# Patient Record
Sex: Male | Born: 1950 | Race: White | Hispanic: No | Marital: Married | Smoking: Never smoker
Health system: Southern US, Community
[De-identification: ages and names within clinical notes are randomized; demographics above are authoritative.]

## PROBLEM LIST (undated history)

## (undated) DIAGNOSIS — E785 Hyperlipidemia, unspecified: Secondary | ICD-10-CM

## (undated) DIAGNOSIS — I2510 Atherosclerotic heart disease of native coronary artery without angina pectoris: Secondary | ICD-10-CM

## (undated) DIAGNOSIS — I219 Acute myocardial infarction, unspecified: Secondary | ICD-10-CM

## (undated) DIAGNOSIS — E119 Type 2 diabetes mellitus without complications: Secondary | ICD-10-CM

## (undated) DIAGNOSIS — I10 Essential (primary) hypertension: Secondary | ICD-10-CM

## (undated) HISTORY — PX: BACK SURGERY: SHX140

## (undated) HISTORY — PX: APPENDECTOMY: SHX54

## (undated) HISTORY — PX: CHOLECYSTECTOMY: SHX55

---

## 2004-07-19 ENCOUNTER — Emergency Department: Payer: Self-pay | Admitting: Emergency Medicine

## 2004-08-02 ENCOUNTER — Ambulatory Visit: Payer: Self-pay | Admitting: Family Medicine

## 2005-11-15 ENCOUNTER — Ambulatory Visit: Payer: Self-pay | Admitting: Family Medicine

## 2015-04-04 ENCOUNTER — Observation Stay
Admission: EM | Admit: 2015-04-04 | Discharge: 2015-04-05 | Disposition: A | Discharge status: Home / Self Care | Payer: Medicare HMO | Attending: Specialist | Admitting: Specialist

## 2015-04-04 ENCOUNTER — Emergency Department: Payer: Medicare HMO

## 2015-04-04 ENCOUNTER — Encounter: Payer: Self-pay | Admitting: Emergency Medicine

## 2015-04-04 DIAGNOSIS — I10 Essential (primary) hypertension: Secondary | ICD-10-CM | POA: Diagnosis present

## 2015-04-04 DIAGNOSIS — I209 Angina pectoris, unspecified: Secondary | ICD-10-CM | POA: Diagnosis present

## 2015-04-04 DIAGNOSIS — I517 Cardiomegaly: Secondary | ICD-10-CM | POA: Insufficient documentation

## 2015-04-04 DIAGNOSIS — E119 Type 2 diabetes mellitus without complications: Secondary | ICD-10-CM | POA: Insufficient documentation

## 2015-04-04 DIAGNOSIS — R9431 Abnormal electrocardiogram [ECG] [EKG]: Secondary | ICD-10-CM | POA: Insufficient documentation

## 2015-04-04 DIAGNOSIS — I25110 Atherosclerotic heart disease of native coronary artery with unstable angina pectoris: Principal | ICD-10-CM | POA: Insufficient documentation

## 2015-04-04 DIAGNOSIS — Z794 Long term (current) use of insulin: Secondary | ICD-10-CM | POA: Diagnosis not present

## 2015-04-04 DIAGNOSIS — E876 Hypokalemia: Secondary | ICD-10-CM | POA: Insufficient documentation

## 2015-04-04 DIAGNOSIS — Z833 Family history of diabetes mellitus: Secondary | ICD-10-CM | POA: Insufficient documentation

## 2015-04-04 DIAGNOSIS — E785 Hyperlipidemia, unspecified: Secondary | ICD-10-CM | POA: Insufficient documentation

## 2015-04-04 DIAGNOSIS — I252 Old myocardial infarction: Secondary | ICD-10-CM | POA: Insufficient documentation

## 2015-04-04 DIAGNOSIS — Z8249 Family history of ischemic heart disease and other diseases of the circulatory system: Secondary | ICD-10-CM | POA: Diagnosis not present

## 2015-04-04 DIAGNOSIS — Z7982 Long term (current) use of aspirin: Secondary | ICD-10-CM | POA: Insufficient documentation

## 2015-04-04 DIAGNOSIS — I2510 Atherosclerotic heart disease of native coronary artery without angina pectoris: Secondary | ICD-10-CM | POA: Diagnosis present

## 2015-04-04 DIAGNOSIS — R079 Chest pain, unspecified: Secondary | ICD-10-CM

## 2015-04-04 DIAGNOSIS — Z9049 Acquired absence of other specified parts of digestive tract: Secondary | ICD-10-CM | POA: Insufficient documentation

## 2015-04-04 DIAGNOSIS — Z79899 Other long term (current) drug therapy: Secondary | ICD-10-CM | POA: Insufficient documentation

## 2015-04-04 HISTORY — DX: Atherosclerotic heart disease of native coronary artery without angina pectoris: I25.10

## 2015-04-04 HISTORY — DX: Hyperlipidemia, unspecified: E78.5

## 2015-04-04 HISTORY — DX: Type 2 diabetes mellitus without complications: E11.9

## 2015-04-04 HISTORY — DX: Essential (primary) hypertension: I10

## 2015-04-04 HISTORY — DX: Acute myocardial infarction, unspecified: I21.9

## 2015-04-04 LAB — CBC
HEMATOCRIT: 39.9 % — AB (ref 40.0–52.0)
Hemoglobin: 13.8 g/dL (ref 13.0–18.0)
MCH: 30.4 pg (ref 26.0–34.0)
MCHC: 34.7 g/dL (ref 32.0–36.0)
MCV: 87.6 fL (ref 80.0–100.0)
Platelets: 150 10*3/uL (ref 150–440)
RBC: 4.55 MIL/uL (ref 4.40–5.90)
RDW: 13.9 % (ref 11.5–14.5)
WBC: 7.6 10*3/uL (ref 3.8–10.6)

## 2015-04-04 MED ORDER — ASPIRIN 81 MG PO CHEW: 324.0000 mg | CHEWABLE_TABLET | Freq: Once | ORAL | Status: DC

## 2015-04-04 NOTE — ED Notes (Addendum)
Pt presents to ED via [facility] from home c/o left sided chest pain beginning at 9 pm tonight. Pt reports took TUMS with "some relief." Pt has hx of HTN and MI. Pt states had similar indigestion feeling with my previous MI. EMS gave patient 2 Nitro sprays with relief and 324 mg Aspirin. Pt denies shortness of breath, dizziness, or N/V/D. Skin warm and dry, respirations even and unlabored. Pt alert and oriented x 4.

## 2015-04-04 NOTE — ED Provider Notes (Signed)
[facility] Emergency Department Provider Note  ____________________________________________  Time seen: 11:15 PM  I have reviewed the triage vital signs and the nursing notes.   HISTORY  Chief Complaint Chest Pain     HPI Ronnie Andersen is a 65 y.o. male with history of myocardial infarction in 2001 without any intervention hypertension and diabetes presents to the emergency department with acute onset of 4 out of 10 central chest pain that was nonradiating. Patient stated that the pain started at approximately 9 PM tonight. Patient states that the pain was improved status post 2 nitroglycerin sprays. Patient was also given 324 mg of aspirin by EMS. Patient denies any shortness of breath no dizziness no nausea vomiting or diarrhea. Patient currently ascribes pain is mild    Past Medical History  Diagnosis Date  . Diabetes mellitus without complication ([place])   . Hypertension   . MI (myocardial infarction) ([place])     There are no active problems to display for this patient.   Past Surgical History  Procedure Laterality Date  . Back surgery    . Appendectomy    . Cholecystectomy      No current outpatient prescriptions on file.  Allergies No known drug allergies No family history on file.  Social History Social History  Substance Use Topics  . Smoking status: Never Smoker   . Smokeless tobacco: None  . Alcohol Use: No    Review of Systems  Constitutional: Negative for fever. Eyes: Negative for visual changes. ENT: Negative for sore throat. Cardiovascular: Positive for chest pain. Respiratory: Negative for shortness of breath. Gastrointestinal: Negative for abdominal pain, vomiting and diarrhea. Genitourinary: Negative for dysuria. Musculoskeletal: Negative for back pain. Skin: Negative for rash. Neurological: Negative for headaches, focal weakness or numbness.   10-point ROS otherwise  negative.  ____________________________________________   PHYSICAL EXAM:  VITAL SIGNS: ED Triage Vitals  Enc Vitals Group     BP 04/04/15 2307 136/89 mmHg     Pulse Rate 04/04/15 2307 70     Resp 04/04/15 2307 18     Temp 04/04/15 2307 98.5 F (36.9 C)     Temp Source 04/04/15 2307 Oral     SpO2 04/04/15 2258 96 %     Weight 04/04/15 2307 230 lb (104.327 kg)     Height 04/04/15 2307 6\' 6"  (1.981 m)     Head Cir --      Peak Flow --      Pain Score 04/04/15 2308 1     Pain Loc --      Pain Edu? --      Excl. in [place]? --      Constitutional: Alert and oriented. Well appearing and in no distress. Eyes: Conjunctivae are normal. PERRL. Normal extraocular movements. ENT   Head: Normocephalic and atraumatic.   Nose: No congestion/rhinnorhea.   Mouth/Throat: Mucous membranes are moist.   Neck: No stridor. Hematological/Lymphatic/Immunilogical: No cervical lymphadenopathy. Cardiovascular: Normal rate, regular rhythm. Normal and symmetric distal pulses are present in all extremities. No murmurs, rubs, or gallops. Respiratory: Normal respiratory effort without tachypnea nor retractions. Breath sounds are clear and equal bilaterally. No wheezes/rales/rhonchi. Gastrointestinal: Soft and nontender. No distention. There is no CVA tenderness. Genitourinary: deferred Musculoskeletal: Nontender with normal range of motion in all extremities. No joint effusions.  No lower extremity tenderness nor edema. Neurologic:  Normal speech and language. No gross focal neurologic deficits are appreciated. Speech is normal.  Skin:  Skin is warm, dry and intact.  No rash noted. Psychiatric: Mood and affect are normal. Speech and behavior are normal. Patient exhibits appropriate insight and judgment.  ____________________________________________    LABS (pertinent positives/negatives)  Labs Reviewed  BASIC METABOLIC PANEL - Abnormal; Notable for the following:    Potassium 3.4 (*)     Glucose, Bld 124 (*)    BUN 24 (*)    All other components within normal limits  CBC - Abnormal; Notable for the following:    HCT 39.9 (*)    All other components within normal limits  TROPONIN I     ____________________________________________   EKG ED ECG REPORT I, [place] N BROWN, the attending physician, personally viewed and interpreted this ECG.   Date: 04/05/2015  EKG Time: 10:57 PM  Rate: 67  Rhythm: Normal sinus rhythm  Axis: Left axis deviation  Intervals: Normal  ST&T Change: Inferior Q waves     RADIOLOGY     DG Chest Port 1 View (Final result) Result time: 04/04/15 23:50:04   Final result by Rad Results In Interface (04/04/15 23:50:04)   Narrative:   CLINICAL DATA: 65 year old male with chest pain  EXAM: PORTABLE CHEST 1 VIEW  COMPARISON: None.  FINDINGS: Single-view of the chest does not demonstrate a focal consolidation. There is no pleural effusion or pneumothorax. Mild to moderate cardiomegaly. No acute osseous pathology identified.  IMPRESSION: No active disease.   Electronically Signed By: Anner Crete M.D. On: 04/04/2015 23:50                INITIAL IMPRESSION / ASSESSMENT AND PLAN / ED COURSE  Pertinent labs & imaging results that were available during my care of the patient were reviewed by me and considered in my medical decision making (see chart for details).  Given history of physical exam multiple cardiac risk factors will admit the patient for further cardiac evaluation and management  ____________________________________________   FINAL CLINICAL IMPRESSION(S) / ED DIAGNOSES  Final diagnoses:  Chest pain, unspecified chest pain type      Gregor Hams, MD 04/05/15 [phone]

## 2015-04-04 NOTE — ED Notes (Signed)
MD at bedside. 

## 2015-04-05 ENCOUNTER — Encounter: Payer: Self-pay | Admitting: Internal Medicine

## 2015-04-05 ENCOUNTER — Observation Stay: Payer: Medicare HMO

## 2015-04-05 DIAGNOSIS — I2510 Atherosclerotic heart disease of native coronary artery without angina pectoris: Secondary | ICD-10-CM | POA: Diagnosis present

## 2015-04-05 DIAGNOSIS — I10 Essential (primary) hypertension: Secondary | ICD-10-CM | POA: Diagnosis present

## 2015-04-05 DIAGNOSIS — I209 Angina pectoris, unspecified: Secondary | ICD-10-CM | POA: Diagnosis present

## 2015-04-05 DIAGNOSIS — E119 Type 2 diabetes mellitus without complications: Secondary | ICD-10-CM

## 2015-04-05 DIAGNOSIS — E785 Hyperlipidemia, unspecified: Secondary | ICD-10-CM | POA: Diagnosis present

## 2015-04-05 LAB — NM MYOCAR MULTI W/SPECT W/WALL MOTION / EF
CHL CUP NUCLEAR SSS: 36
CHL CUP RESTING HR STRESS: 84 {beats}/min
CHL CUP STRESS STAGE 1 GRADE: 0 %
CHL CUP STRESS STAGE 1 SPEED: 0 mph
CHL CUP STRESS STAGE 2 GRADE: 0 %
CHL CUP STRESS STAGE 2 HR: 108 {beats}/min
CHL CUP STRESS STAGE 3 HR: 102 {beats}/min
CHL CUP STRESS STAGE 4 GRADE: 0 %
CHL CUP STRESS STAGE 4 SPEED: 0.1 mph
CHL CUP STRESS STAGE 5 HR: 127 {beats}/min
CHL CUP STRESS STAGE 6 HR: 144 {beats}/min
CHL CUP STRESS STAGE 7 HR: 127 {beats}/min
CHL CUP STRESS STAGE 7 SPEED: 0 mph
CHL CUP STRESS STAGE 8 HR: 100 {beats}/min
CHL CUP STRESS STAGE 8 SBP: 170 mmHg
CHL CUP STRESS STAGE 8 SPEED: 0 mph
CSEPED: 5 min
CSEPEDS: 0 s
CSEPEW: 7 METS
CSEPPMHR: 93 %
LV sys vol: 185 mL
LVDIAVOL: 290 mL (ref 62–150)
NUC STRESS TID: 0.9
Peak HR: 144 {beats}/min
SDS: 3
SRS: 33
Stage 1 HR: 108 {beats}/min
Stage 2 Speed: 0 mph
Stage 3 Grade: 0 %
Stage 3 Speed: 0.1 mph
Stage 4 HR: 101 {beats}/min
Stage 5 Grade: 10 %
Stage 5 Speed: 1.7 mph
Stage 6 Grade: 12 %
Stage 6 Speed: 2.5 mph
Stage 7 DBP: 96 mmHg
Stage 7 Grade: 0 %
Stage 7 SBP: 183 mmHg
Stage 8 DBP: 96 mmHg
Stage 8 Grade: 0 %

## 2015-04-05 LAB — CBC
HCT: 39.4 % — ABNORMAL LOW (ref 40.0–52.0)
Hemoglobin: 13.9 g/dL (ref 13.0–18.0)
MCH: 31 pg (ref 26.0–34.0)
MCHC: 35.1 g/dL (ref 32.0–36.0)
MCV: 88.1 fL (ref 80.0–100.0)
PLATELETS: 139 10*3/uL — AB (ref 150–440)
RBC: 4.47 MIL/uL (ref 4.40–5.90)
RDW: 13.9 % (ref 11.5–14.5)
WBC: 7.1 10*3/uL (ref 3.8–10.6)

## 2015-04-05 LAB — BASIC METABOLIC PANEL
Anion gap: 6 (ref 5–15)
Anion gap: 5 (ref 5–15)
BUN: 24 mg/dL — AB (ref 6–20)
BUN: 21 mg/dL — AB (ref 6–20)
CO2: 25 mmol/L (ref 22–32)
CO2: 26 mmol/L (ref 22–32)
CREATININE: 1.13 mg/dL (ref 0.61–1.24)
Calcium: 9.5 mg/dL (ref 8.9–10.3)
Calcium: 9.3 mg/dL (ref 8.9–10.3)
Chloride: 107 mmol/L (ref 101–111)
Chloride: 105 mmol/L (ref 101–111)
Creatinine, Ser: 1.04 mg/dL (ref 0.61–1.24)
GFR calc Af Amer: >60 mL/min (ref >60)
GFR calc Af Amer: >60 mL/min (ref >60)
GFR calc non Af Amer: >60 mL/min (ref >60)
GLUCOSE: 124 mg/dL — AB (ref 65–99)
GLUCOSE: 133 mg/dL — AB (ref 65–99)
Potassium: 3.4 mmol/L — ABNORMAL LOW (ref 3.5–5.1)
Potassium: 3.5 mmol/L (ref 3.5–5.1)
SODIUM: 138 mmol/L (ref 135–145)
Sodium: 136 mmol/L (ref 135–145)

## 2015-04-05 LAB — HEMOGLOBIN A1C: Hgb A1c MFr Bld: 6.7 % — ABNORMAL HIGH (ref 4.0–6.0)

## 2015-04-05 LAB — TROPONIN I: Troponin I: <0.03 ng/mL (ref <0.031)

## 2015-04-05 LAB — GLUCOSE, CAPILLARY
GLUCOSE-CAPILLARY: 125 mg/dL — AB (ref 65–99)
Glucose-Capillary: 140 mg/dL — ABNORMAL HIGH (ref 65–99)
Glucose-Capillary: 128 mg/dL — ABNORMAL HIGH (ref 65–99)
Glucose-Capillary: 213 mg/dL — ABNORMAL HIGH (ref 65–99)

## 2015-04-05 MED ORDER — INSULIN ASPART 100 UNIT/ML ~~LOC~~ SOLN
0.0000 [IU] | Freq: Four times a day (QID) | SUBCUTANEOUS | Status: DC
  Administered 2015-04-05: 1 [IU] via SUBCUTANEOUS
  Filled 2015-04-05: qty 1

## 2015-04-05 MED ORDER — ACETAMINOPHEN 650 MG RE SUPP: 650.0000 mg | Freq: Four times a day (QID) | RECTAL | Status: DC | PRN

## 2015-04-05 MED ORDER — TECHNETIUM TC 99M SESTAMIBI - CARDIOLITE
13.0000 | Freq: Once | INTRAVENOUS | Status: AC | PRN
  Administered 2015-04-05: 11:00:00 13.078 via INTRAVENOUS

## 2015-04-05 MED ORDER — NITROGLYCERIN 0.3 MG SL SUBL: 0.3000 mg | SUBLINGUAL_TABLET | SUBLINGUAL | Status: AC | PRN

## 2015-04-05 MED ORDER — HYDRALAZINE HCL 20 MG/ML IJ SOLN: 10.0000 mg | INTRAMUSCULAR | Status: DC | PRN

## 2015-04-05 MED ORDER — ASPIRIN EC 81 MG PO TBEC
81.0000 mg | DELAYED_RELEASE_TABLET | Freq: Every day | ORAL | Status: DC
  Administered 2015-04-05: 81 mg via ORAL
  Filled 2015-04-05: qty 1

## 2015-04-05 MED ORDER — METOPROLOL TARTRATE 50 MG PO TABS
50.0000 mg | ORAL_TABLET | Freq: Two times a day (BID) | ORAL | Status: DC
  Administered 2015-04-05: 50 mg via ORAL
  Filled 2015-04-05: qty 1

## 2015-04-05 MED ORDER — TECHNETIUM TC 99M SESTAMIBI GENERIC - CARDIOLITE
31.8900 | Freq: Once | INTRAVENOUS | Status: AC | PRN
  Administered 2015-04-05: 31.89 via INTRAVENOUS

## 2015-04-05 MED ORDER — ONDANSETRON HCL 4 MG PO TABS: 4.0000 mg | ORAL_TABLET | Freq: Four times a day (QID) | ORAL | Status: DC | PRN

## 2015-04-05 MED ORDER — LISINOPRIL 20 MG PO TABS
20.0000 mg | ORAL_TABLET | Freq: Every day | ORAL | Status: DC
  Administered 2015-04-05: 20 mg via ORAL
  Filled 2015-04-05: qty 1

## 2015-04-05 MED ORDER — ONDANSETRON HCL 4 MG/2ML IJ SOLN: 4.0000 mg | Freq: Four times a day (QID) | INTRAMUSCULAR | Status: DC | PRN

## 2015-04-05 MED ORDER — ENOXAPARIN SODIUM 40 MG/0.4ML ~~LOC~~ SOLN
40.0000 mg | SUBCUTANEOUS | Status: DC
  Administered 2015-04-05: 40 mg via SUBCUTANEOUS
  Filled 2015-04-05: qty 0.4

## 2015-04-05 MED ORDER — SODIUM CHLORIDE 0.9% FLUSH
3.0000 mL | Freq: Two times a day (BID) | INTRAVENOUS | Status: DC
  Administered 2015-04-05: 3 mL via INTRAVENOUS

## 2015-04-05 MED ORDER — ACETAMINOPHEN 325 MG PO TABS: 650.0000 mg | ORAL_TABLET | Freq: Four times a day (QID) | ORAL | Status: DC | PRN

## 2015-04-05 MED ORDER — PRAVASTATIN SODIUM 20 MG PO TABS: 40.0000 mg | ORAL_TABLET | Freq: Every day | ORAL | Status: DC

## 2015-04-05 MED ORDER — NITROGLYCERIN 0.4 MG SL SUBL: 0.4000 mg | SUBLINGUAL_TABLET | SUBLINGUAL | Status: DC | PRN

## 2015-04-05 NOTE — Progress Notes (Signed)
Patient off the unit for stress test.

## 2015-04-05 NOTE — H&P (Signed)
[place] [facility] [place] NAME: Antario Delfino    MR#:  [id number]  DATE OF BIRTH:  January 07, 1951  DATE OF ADMISSION:  04/04/2015  PRIMARY CARE PHYSICIAN: Pcp Not In System   REQUESTING/REFERRING PHYSICIAN: Owens Shark, M.D.  CHIEF COMPLAINT:   Chief Complaint  Patient presents with  . Chest Pain    HISTORY OF PRESENT ILLNESS:  Kyung Woodman  is a 65 y.o. male who presents with angina. Patient is a prior history of CAD and prior MI. He states that he was at church on the evening of presentation to ED when he began to develop some "indigestion." He took some over-the-counter antacids, with no relief, and the pain became more progressive and he had some diaphoresis. At that point his family insisted that he come to the ED for evaluation. Here his workup was initially negative except that his EKG has some changes that could represent ischemia. Looks like he probably has some old changes in his anterior leads, with some potential changes in his inferior and may be septal leads that are age indeterminate. We do not have an old EKG in our system to compare to, and on chart review can only see the machine interpretation for other EKGs, which do comment on his old anterior lead infarct. Hospitalists were called for further evaluation for potential ACS.  PAST MEDICAL HISTORY:   Past Medical History  Diagnosis Date  . Diabetes mellitus without complication ([place])   . Hypertension   . MI (myocardial infarction) ([place])   . HLD (hyperlipidemia)   . CAD (coronary artery disease)     PAST SURGICAL HISTORY:   Past Surgical History  Procedure Laterality Date  . Back surgery    . Appendectomy    . Cholecystectomy      SOCIAL HISTORY:   Social History  Substance Use Topics  . Smoking status: Never Smoker   . Smokeless tobacco: Not on file  . Alcohol Use: No    FAMILY HISTORY:   Family History  Problem Relation Age of Onset  . Heart attack Father   .  Diabetes Father   . Diabetes Mother   . Hypertension Mother     DRUG ALLERGIES:  No Known Allergies  MEDICATIONS AT HOME:   Prior to Admission medications   Medication Sig Start Date End Date Taking? Authorizing Provider  aspirin EC 81 MG tablet Take 81 mg by mouth daily.   Yes Historical Provider, MD  glipiZIDE (GLUCOTROL) 10 MG tablet Take 10 mg by mouth 2 (two) times daily before a meal.   Yes Historical Provider, MD  lisinopril (PRINIVIL,ZESTRIL) 20 MG tablet Take 20 mg by mouth daily.   Yes Historical Provider, MD  lovastatin (MEVACOR) 20 MG tablet Take 40 mg by mouth at bedtime.   Yes Historical Provider, MD  metFORMIN (GLUCOPHAGE) 1000 MG tablet Take 1,000 mg by mouth 2 (two) times daily with a meal.   Yes Historical Provider, MD  metoprolol (LOPRESSOR) 50 MG tablet Take 50 mg by mouth 2 (two) times daily.   Yes Historical Provider, MD    REVIEW OF SYSTEMS:  Review of Systems  Constitutional: Positive for diaphoresis. Negative for fever, chills, weight loss and malaise/fatigue.  HENT: Negative for ear pain, hearing loss and tinnitus.   Eyes: Negative for blurred vision, double vision, pain and redness.  Respiratory: Negative for cough, hemoptysis and shortness of breath.   Cardiovascular: Positive for chest pain. Negative for palpitations, orthopnea and leg  swelling.  Gastrointestinal: Negative for nausea, vomiting, abdominal pain, diarrhea and constipation.  Genitourinary: Negative for dysuria, frequency and hematuria.  Musculoskeletal: Negative for back pain, joint pain and neck pain.  Skin:       No acne, rash, or lesions  Neurological: Negative for dizziness, tremors, focal weakness and weakness.  Endo/Heme/Allergies: Negative for polydipsia. Does not bruise/bleed easily.  Psychiatric/Behavioral: Negative for depression. The patient is not nervous/anxious and does not have insomnia.      VITAL SIGNS:   Filed Vitals:   04/04/15 2258 04/04/15 2307 04/05/15 0000  04/05/15 0030  BP:  136/89 140/85 145/89  Pulse:  70 70 64  Temp:  98.5 F (36.9 C)    TempSrc:  Oral    Resp:  18 13 13   Height:  6\' 6"  (1.981 m)    Weight:  104.327 kg (230 lb)    SpO2: 96% 95% 97% 98%   Wt Readings from Last 3 Encounters:  04/04/15 104.327 kg (230 lb)    PHYSICAL EXAMINATION:  Physical Exam  Vitals reviewed. Constitutional: He is oriented to person, place, and time. He appears well-developed and well-nourished. No distress.  HENT:  Head: Normocephalic and atraumatic.  Mouth/Throat: Oropharynx is clear and moist.  Eyes: Conjunctivae and EOM are normal. Pupils are equal, round, and reactive to light. No scleral icterus.  Neck: Normal range of motion. Neck supple. No JVD present. No thyromegaly present.  Cardiovascular: Normal rate, regular rhythm and intact distal pulses.  Exam reveals no gallop and no friction rub.   No murmur heard. Respiratory: Effort normal and breath sounds normal. No respiratory distress. He has no wheezes. He has no rales.  GI: Soft. Bowel sounds are normal. He exhibits no distension. There is no tenderness.  Musculoskeletal: Normal range of motion. He exhibits no edema.  No arthritis, no gout  Lymphadenopathy:    He has no cervical adenopathy.  Neurological: He is alert and oriented to person, place, and time. No cranial nerve deficit.  No dysarthria, no aphasia  Skin: Skin is warm and dry. No rash noted. No erythema.  Psychiatric: He has a normal mood and affect. His behavior is normal. Judgment and thought content normal.    LABORATORY PANEL:   CBC  Recent Labs Lab 04/04/15 2322  WBC 7.6  HGB 13.8  HCT 39.9*  PLT 150   ------------------------------------------------------------------------------------------------------------------  Chemistries   Recent Labs Lab 04/04/15 2322  NA 138  K 3.4*  CL 107  CO2 25  GLUCOSE 124*  BUN 24*  CREATININE 1.13  CALCIUM 9.5    ------------------------------------------------------------------------------------------------------------------  Cardiac Enzymes  Recent Labs Lab 04/04/15 2322  TROPONINI <0.03   ------------------------------------------------------------------------------------------------------------------  RADIOLOGY:  Dg Chest Port 1 View  04/04/2015  CLINICAL DATA:  65 year old male with chest pain EXAM: PORTABLE CHEST 1 VIEW COMPARISON:  None. FINDINGS: Single-view of the chest does not demonstrate a focal consolidation. There is no pleural effusion or pneumothorax. Mild to moderate cardiomegaly. No acute osseous pathology identified. IMPRESSION: No active disease. Electronically Signed   By: Anner Crete M.D.   On: 04/04/2015 23:50    EKG:   Orders placed or performed during the hospital encounter of 04/04/15  . EKG 12-Lead  . EKG 12-Lead  . ED EKG  . ED EKG    IMPRESSION AND PLAN:  Principal Problem:   Angina pectoris ([place]) - currently chest pain-free, his pain was improved with nitroglycerin. We will bring him in for observation with telemetry, serially trend his cardiac enzymes -  his initial cardiac enzyme was negative, get an echocardiogram and cardiology consult. Active Problems:   CAD (coronary artery disease) - continue home meds for the same, other workup as above   HTN (hypertension) - continue home meds as well as when necessary antihypertensives his blood pressure less than 160/100   Type 2 diabetes mellitus (HCC) - sliding scale insulin with corresponding glucose checks every 6 hours while nothing by mouth for possible cardiac procedure   HLD (hyperlipidemia) - continue home meds  All the records are reviewed and case discussed with ED provider. Management plans discussed with the patient and/or family.  DVT PROPHYLAXIS: SubQ lovenox  GI PROPHYLAXIS: None  ADMISSION STATUS: Observation  CODE STATUS: Full Code Status History    This patient does not have a  recorded code status. Please follow your organizational policy for patients in this situation.      TOTAL TIME TAKING CARE OF THIS PATIENT: 40 minutes.    Ruther Ephraim [place] 04/05/2015, 1:49 AM  [facility]  Office  [phone]  CC: Primary care physician; Pcp Not In System

## 2015-04-05 NOTE — Plan of Care (Signed)
Problem: Phase I Progression Outcomes Goal: Anginal pain relieved Outcome: Progressing No pain noted on shift will continue to monitor.

## 2015-04-05 NOTE — Plan of Care (Signed)
Problem: Phase I Progression Outcomes Goal: MD aware of Cardiac Marker results Outcome: Completed/Met Date Met:  04/05/15 Troponin was negative X2.

## 2015-04-05 NOTE — Progress Notes (Signed)
Patient being discharge home, discharge instruction provided, iv removed tele removed, patient to follow up with primary care and cardiologist as per discharge instruction. Patient left the facility with family at this time.

## 2015-04-05 NOTE — Consult Note (Signed)
Reason for Consult: Angina abnormal EKG Referring Physician: Dr. Owens Shark, Dr. Lance Coon hospitalist Cardiologist Dr. Rockey Situ is an 65 y.o. male.  HPI: Patient has a history of known coronary disease including myocardial infarction in the process did not receive intervention or coronary bypass patient was a late Programmer, applications. The patient has abnormal EKG with Q waves anteriorly and inferiorly. Patient states he had an episode of what he thought was indigestion took an antacid pain became progressive with some diaphoresis sat at some point and family insisted the patient comes emergency room for evaluation. The patient had improvement in his symptoms with had an abnormal EKG with no leakage either compare patient was then recommended for admission and further evaluation.  Past Medical History  Diagnosis Date  . Diabetes mellitus without complication ([place])   . Hypertension   . MI (myocardial infarction) ([place])   . HLD (hyperlipidemia)   . CAD (coronary artery disease)     Past Surgical History  Procedure Laterality Date  . Back surgery    . Appendectomy    . Cholecystectomy      Family History  Problem Relation Age of Onset  . Heart attack Father   . Diabetes Father   . Diabetes Mother   . Hypertension Mother     Social History:  reports that he has never smoked. He does not have any smokeless tobacco history on file. He reports that he does not drink alcohol or use illicit drugs.  Allergies: No Known Allergies  Medications: I have reviewed the patient's current medications.  Results for orders placed or performed during the hospital encounter of 04/04/15 (from the past 48 hour(s))  Basic metabolic panel     Status: Abnormal   Collection Time: 04/04/15 11:22 PM  Result Value Ref Range   Sodium 138 135 - 145 mmol/L   Potassium 3.4 (L) 3.5 - 5.1 mmol/L   Chloride 107 101 - 111 mmol/L   CO2 25 22 - 32 mmol/L   Glucose, Bld 124 (H) 65 - 99 mg/dL   BUN 24 (H) 6 - 20  mg/dL   Creatinine, Ser 1.13 0.61 - 1.24 mg/dL   Calcium 9.5 8.9 - 10.3 mg/dL   GFR calc non Af Amer >60 >60 mL/min   GFR calc Af Amer >60 >60 mL/min    Comment: (NOTE) The eGFR has been calculated using the CKD EPI equation. This calculation has not been validated in all clinical situations. eGFR's persistently <60 mL/min signify possible Chronic Kidney Disease.    Anion gap 6 5 - 15  CBC     Status: Abnormal   Collection Time: 04/04/15 11:22 PM  Result Value Ref Range   WBC 7.6 3.8 - 10.6 K/uL   RBC 4.55 4.40 - 5.90 MIL/uL   Hemoglobin 13.8 13.0 - 18.0 g/dL   HCT 39.9 (L) 40.0 - 52.0 %   MCV 87.6 80.0 - 100.0 fL   MCH 30.4 26.0 - 34.0 pg   MCHC 34.7 32.0 - 36.0 g/dL   RDW 13.9 11.5 - 14.5 %   Platelets 150 150 - 440 K/uL  Troponin I     Status: None   Collection Time: 04/04/15 11:22 PM  Result Value Ref Range   Troponin I <0.03 <0.031 ng/mL    Comment:        NO INDICATION OF MYOCARDIAL INJURY.   Troponin I     Status: None   Collection Time: 04/05/15  3:03 AM  Result Value  Ref Range   Troponin I <0.03 <0.031 ng/mL    Comment:        NO INDICATION OF MYOCARDIAL INJURY.   Glucose, capillary     Status: Abnormal   Collection Time: 04/05/15  4:24 AM  Result Value Ref Range   Glucose-Capillary 140 (H) 65 - 99 mg/dL  CBC     Status: Abnormal   Collection Time: 04/05/15  5:39 AM  Result Value Ref Range   WBC 7.1 3.8 - 10.6 K/uL   RBC 4.47 4.40 - 5.90 MIL/uL   Hemoglobin 13.9 13.0 - 18.0 g/dL   HCT 39.4 (L) 40.0 - 52.0 %   MCV 88.1 80.0 - 100.0 fL   MCH 31.0 26.0 - 34.0 pg   MCHC 35.1 32.0 - 36.0 g/dL   RDW 13.9 11.5 - 14.5 %   Platelets 139 (L) 150 - 440 K/uL  Basic metabolic panel     Status: Abnormal   Collection Time: 04/05/15  5:39 AM  Result Value Ref Range   Sodium 136 135 - 145 mmol/L   Potassium 3.5 3.5 - 5.1 mmol/L   Chloride 105 101 - 111 mmol/L   CO2 26 22 - 32 mmol/L   Glucose, Bld 133 (H) 65 - 99 mg/dL   BUN 21 (H) 6 - 20 mg/dL   Creatinine,  Ser 1.04 0.61 - 1.24 mg/dL   Calcium 9.3 8.9 - 10.3 mg/dL   GFR calc non Af Amer >60 >60 mL/min   GFR calc Af Amer >60 >60 mL/min    Comment: (NOTE) The eGFR has been calculated using the CKD EPI equation. This calculation has not been validated in all clinical situations. eGFR's persistently <60 mL/min signify possible Chronic Kidney Disease.    Anion gap 5 5 - 15  Glucose, capillary     Status: Abnormal   Collection Time: 04/05/15  7:23 AM  Result Value Ref Range   Glucose-Capillary 125 (H) 65 - 99 mg/dL   Comment 1 Notify RN   Troponin I     Status: None   Collection Time: 04/05/15  9:14 AM  Result Value Ref Range   Troponin I <0.03 <0.031 ng/mL    Comment:        NO INDICATION OF MYOCARDIAL INJURY.   Glucose, capillary     Status: Abnormal   Collection Time: 04/05/15  1:10 PM  Result Value Ref Range   Glucose-Capillary 128 (H) 65 - 99 mg/dL   Comment 1 Notify RN     Dg Chest Port 1 View  04/04/2015  CLINICAL DATA:  65 year old male with chest pain EXAM: PORTABLE CHEST 1 VIEW COMPARISON:  None. FINDINGS: Single-view of the chest does not demonstrate a focal consolidation. There is no pleural effusion or pneumothorax. Mild to moderate cardiomegaly. No acute osseous pathology identified. IMPRESSION: No active disease. Electronically Signed   By: Anner Crete M.D.   On: 04/04/2015 23:50    Review of Systems  Constitutional: Positive for malaise/fatigue.  HENT: Positive for congestion.   Eyes: Negative.   Respiratory: Negative.   Cardiovascular: Positive for chest pain, palpitations and orthopnea.  Gastrointestinal: Negative.   Genitourinary: Negative.   Musculoskeletal: Negative.   Skin: Negative.   Neurological: Positive for weakness.  Endo/Heme/Allergies: Negative.   Psychiatric/Behavioral: Negative.    Blood pressure 129/85, pulse 72, temperature 98.4 F (36.9 C), temperature source Oral, resp. rate 20, height '6\' 6"'  (1.981 m), weight 98.839 kg (217 lb 14.4  oz), SpO2 93 %. Physical Exam  Nursing note and vitals reviewed. Constitutional: He is oriented to person, place, and time. He appears well-developed and well-nourished.  HENT:  Head: Normocephalic and atraumatic.  Eyes: Conjunctivae and EOM are normal. Pupils are equal, round, and reactive to light.  Neck: Normal range of motion. Neck supple.  Cardiovascular: Normal rate and regular rhythm.   Murmur heard. Respiratory: Effort normal and breath sounds normal.  GI: Soft. Bowel sounds are normal.  Musculoskeletal: Normal range of motion.  Neurological: He is alert and oriented to person, place, and time. He has normal reflexes.  Skin: Skin is warm and dry.  Psychiatric: He has a normal mood and affect.    Assessment/Plan: Unstable angina Coronary artery disease Hypertension Hyperlipidemia Diabetes Abnormal EKG . PLAN Agree with rule out myocardial infarction Follow-up EKGs troponins Consider echocardiogram for angina and abnormal EKG Short-term anticoagulation Hypertension control with Prinivil and Lopressor Continue glipizide and metformin for diabetes management Recommend functional study to rule out for ischemia Hypokalemia recommend replacement DVT prophylaxis hyperlipidemia continue lovastatin therapy Follow-up with primary cardiologist as an outpatient Dr. Madelyn Brunner D. 04/05/2015, 1:45 PM

## 2015-04-05 NOTE — Plan of Care (Signed)
Problem: Consults Goal: Chest Pain Patient Education (See Patient Education module for education specifics.) Outcome: Completed/Met Date Met:  04/05/15 Cardiologist consult ordered

## 2015-04-05 NOTE — Plan of Care (Signed)
Problem: Phase I Progression Outcomes Goal: Hemodynamically stable Outcome: Completed/Met Date Met:  04/05/15 Patient and VS are stable

## 2015-04-05 NOTE — Progress Notes (Signed)
Patient returned to unit from radiology. Patient alert and oriented, denies pain , family at bedside, stress test completed, patient able to walk to bathroom without assist, diet order place as per MD telephone order.

## 2015-04-05 NOTE — Plan of Care (Signed)
Problem: Phase I Progression Outcomes Goal: Voiding-avoid urinary catheter unless indicated Outcome: Completed/Met Date Met:  04/05/15 Patient has been voiding normally.

## 2015-04-06 NOTE — Discharge Summary (Signed)
[facility] NAME: Ronnie Andersen    MR#:  [id number]  DATE OF BIRTH:  1950/03/14  DATE OF ADMISSION:  04/04/2015 ADMITTING PHYSICIAN: Lance Coon, MD  DATE OF DISCHARGE: 04/05/2015  5:10 PM  PRIMARY CARE PHYSICIAN: Pcp Not In System    ADMISSION DIAGNOSIS:  Chest pain, unspecified chest pain type [R07.9]  DISCHARGE DIAGNOSIS:  Principal Problem:   Angina pectoris ([place]) Active Problems:   CAD (coronary artery disease)   HTN (hypertension)   HLD (hyperlipidemia)   Type 2 diabetes mellitus ([place])   SECONDARY DIAGNOSIS:   Past Medical History  Diagnosis Date  . Diabetes mellitus without complication ([place])   . Hypertension   . MI (myocardial infarction) ([place])   . HLD (hyperlipidemia)   . CAD (coronary artery disease)     HOSPITAL COURSE:   65 year old male with past medical history of diabetes type 2 without complication, hypertension, history of previous MI, history of coronary artery disease, hyperlipidemia who presented to the hospital with chest pain.  #1 chest pain-given the patient's significant cardiac risk factors he was admitted to the hospital and observed under telemetry. -He had 3 sets of cardiac markers checked which were negative. He underwent a nuclear medicine stress test which showed an old anterior scar but no evidence of acute reversible ischemia. -Patient is currently chest pain-free and hemodynamically stable and therefore being discharged home with close follow-up with cardiology. He will continue his aspirin, beta blocker, statin and he was also given a prescription for as needed sublingual nitroglycerin glycerin. If it continues to have further chest pain he likely will benefit from a cardiac catheterization.  #2 diabetes type 2 without complication-while in the hospital patient was maintained on sliding scale insulin. -He was advised to hold his metformin for the next 24 hours and then resume it. He  will continue his glipizide.  #3 hyperlipidemia-patient will continue his lovastatin.    DISCHARGE CONDITIONS:   Stable  CONSULTS OBTAINED:  Treatment Team:  Yolonda Kida, MD  DRUG ALLERGIES:  No Known Allergies  DISCHARGE MEDICATIONS:   Discharge Medication List as of 04/05/2015  5:05 PM    START taking these medications   Details  nitroGLYCERIN (NITROSTAT) 0.3 MG SL tablet Place 1 tablet (0.3 mg total) under the tongue every 5 (five) minutes as needed for chest pain., Starting 04/05/2015, Until Discontinued, Print      CONTINUE these medications which have NOT CHANGED   Details  aspirin EC 81 MG tablet Take 81 mg by mouth daily., Until Discontinued, Historical Med    glipiZIDE (GLUCOTROL) 10 MG tablet Take 10 mg by mouth 2 (two) times daily before a meal., Until Discontinued, Historical Med    lisinopril (PRINIVIL,ZESTRIL) 20 MG tablet Take 20 mg by mouth daily., Until Discontinued, Historical Med    lovastatin (MEVACOR) 20 MG tablet Take 40 mg by mouth at bedtime., Until Discontinued, Historical Med    metFORMIN (GLUCOPHAGE) 1000 MG tablet Take 1,000 mg by mouth 2 (two) times daily with a meal., Until Discontinued, Historical Med    metoprolol (LOPRESSOR) 50 MG tablet Take 50 mg by mouth 2 (two) times daily., Until Discontinued, Historical Med         DISCHARGE INSTRUCTIONS:   DIET:  Cardiac diet and Diabetic diet  DISCHARGE CONDITION:  Stable  ACTIVITY:  Activity as tolerated  OXYGEN:  Home Oxygen: No.   Oxygen Delivery: room air  DISCHARGE LOCATION:  home  If you experience worsening of your admission symptoms, develop shortness of breath, life threatening emergency, suicidal or homicidal thoughts you must seek medical attention immediately by calling 911 or calling your MD immediately  if symptoms less severe.  You Must read complete instructions/literature along with all the possible adverse reactions/side effects for all the Medicines you  take and that have been prescribed to you. Take any new Medicines after you have completely understood and accpet all the possible adverse reactions/side effects.   Please note  You were cared for by a hospitalist during your hospital stay. If you have any questions about your discharge medications or the care you received while you were in the hospital after you are discharged, you can call the unit and asked to speak with the hospitalist on call if the hospitalist that took care of you is not available. Once you are discharged, your primary care physician will handle any further medical issues. Please note that NO REFILLS for any discharge medications will be authorized once you are discharged, as it is imperative that you return to your primary care physician (or establish a relationship with a primary care physician if you do not have one) for your aftercare needs so that they can reassess your need for medications and monitor your lab values.     Today  No chest pain, shortness of breath, nausea, vomiting.    VITAL SIGNS:  Blood pressure 129/85, pulse 72, temperature 98.4 F (36.9 C), temperature source Oral, resp. rate 20, height 6\' 6"  (1.981 m), weight 98.839 kg (217 lb 14.4 oz), SpO2 93 %.  I/O:  No intake or output data in the 24 hours ending 04/06/15 1529  PHYSICAL EXAMINATION:  GENERAL:  65 y.o.-year-old patient lying in the bed with no acute distress.  EYES: Pupils equal, round, reactive to light and accommodation. No scleral icterus. Extraocular muscles intact.  HEENT: Head atraumatic, normocephalic. Oropharynx and nasopharynx clear.  NECK:  Supple, no jugular venous distention. No thyroid enlargement, no tenderness.  LUNGS: Normal breath sounds bilaterally, no wheezing, rales,rhonchi. No use of accessory muscles of respiration.  CARDIOVASCULAR: S1, S2 normal. No murmurs, rubs, or gallops.  ABDOMEN: Soft, non-tender, non-distended. Bowel sounds present. No organomegaly or  mass.  EXTREMITIES: No pedal edema, cyanosis, or clubbing.  NEUROLOGIC: Cranial nerves II through XII are intact. No focal motor or sensory defecits b/l.  PSYCHIATRIC: The patient is alert and oriented x 3. Good affect.  SKIN: No obvious rash, lesion, or ulcer.   DATA REVIEW:   CBC  Recent Labs Lab 04/05/15 0539  WBC 7.1  HGB 13.9  HCT 39.4*  PLT 139*    Chemistries   Recent Labs Lab 04/05/15 0539  NA 136  K 3.5  CL 105  CO2 26  GLUCOSE 133*  BUN 21*  CREATININE 1.04  CALCIUM 9.3    Cardiac Enzymes  Recent Labs Lab 04/05/15 1512  [place] <0.03    Microbiology Results  No results found for this or any previous visit.  RADIOLOGY:  Dg Chest Port 1 View  04/04/2015  CLINICAL DATA:  65 year old male with chest pain EXAM: PORTABLE CHEST 1 VIEW COMPARISON:  None. FINDINGS: Single-view of the chest does not demonstrate a focal consolidation. There is no pleural effusion or pneumothorax. Mild to moderate cardiomegaly. No acute osseous pathology identified. IMPRESSION: No active disease. Electronically Signed   By: Anner Crete M.D.   On: 04/04/2015 23:50      Management plans discussed with the patient, family  and they are in agreement.  CODE STATUS:  Code Status History    Date Active Date Inactive Code Status Order ID Comments User Context   04/05/2015  4:20 AM 04/05/2015  8:41 PM Full Code [id number]  Lance Coon, MD Inpatient      TOTAL TIME TAKING CARE OF THIS PATIENT: 40 minutes.    Henreitta Leber M.D on 04/06/2015 at 3:29 PM  Between 7am to 6pm - Pager - [phone]  After 6pm go to [email or web address] - password [email or web address] [facility]  Office  [phone]  CC: Primary care physician; Pcp Not In System

## 2017-07-19 ENCOUNTER — Inpatient Hospital Stay: Admission: RE | Admit: 2017-07-19 | Payer: Medicare HMO | Source: Ambulatory Visit

## 2017-07-26 ENCOUNTER — Encounter: Admission: RE | Payer: Self-pay | Source: Ambulatory Visit

## 2017-07-26 ENCOUNTER — Ambulatory Visit: Admission: RE | Admit: 2017-07-26 | Payer: Medicare HMO | Source: Ambulatory Visit | Admitting: Cardiology

## 2017-07-26 SURGERY — ICD GENERATOR CHANGE
Anesthesia: Choice

## 2017-10-20 ENCOUNTER — Emergency Department: Payer: Medicare HMO

## 2017-10-20 ENCOUNTER — Inpatient Hospital Stay
Admission: EM | Admit: 2017-10-20 | Discharge: 2017-10-23 | DRG: 281 | Disposition: A | Discharge status: Other Acute Inpatient Hospital | Payer: Medicare HMO | Attending: Internal Medicine | Admitting: Internal Medicine

## 2017-10-20 ENCOUNTER — Other Ambulatory Visit: Payer: Self-pay

## 2017-10-20 DIAGNOSIS — Z79899 Other long term (current) drug therapy: Secondary | ICD-10-CM

## 2017-10-20 DIAGNOSIS — I083 Combined rheumatic disorders of mitral, aortic and tricuspid valves: Secondary | ICD-10-CM | POA: Diagnosis present

## 2017-10-20 DIAGNOSIS — Z833 Family history of diabetes mellitus: Secondary | ICD-10-CM

## 2017-10-20 DIAGNOSIS — E119 Type 2 diabetes mellitus without complications: Secondary | ICD-10-CM | POA: Diagnosis present

## 2017-10-20 DIAGNOSIS — I214 Non-ST elevation (NSTEMI) myocardial infarction: Principal | ICD-10-CM | POA: Diagnosis present

## 2017-10-20 DIAGNOSIS — I471 Supraventricular tachycardia: Secondary | ICD-10-CM | POA: Diagnosis present

## 2017-10-20 DIAGNOSIS — I110 Hypertensive heart disease with heart failure: Secondary | ICD-10-CM | POA: Diagnosis present

## 2017-10-20 DIAGNOSIS — I472 Ventricular tachycardia, unspecified: Secondary | ICD-10-CM

## 2017-10-20 DIAGNOSIS — R079 Chest pain, unspecified: Secondary | ICD-10-CM | POA: Diagnosis not present

## 2017-10-20 DIAGNOSIS — M1A9XX0 Chronic gout, unspecified, without tophus (tophi): Secondary | ICD-10-CM | POA: Diagnosis present

## 2017-10-20 DIAGNOSIS — Z8249 Family history of ischemic heart disease and other diseases of the circulatory system: Secondary | ICD-10-CM

## 2017-10-20 DIAGNOSIS — I255 Ischemic cardiomyopathy: Secondary | ICD-10-CM | POA: Diagnosis present

## 2017-10-20 DIAGNOSIS — Z7984 Long term (current) use of oral hypoglycemic drugs: Secondary | ICD-10-CM

## 2017-10-20 DIAGNOSIS — E876 Hypokalemia: Secondary | ICD-10-CM | POA: Diagnosis present

## 2017-10-20 DIAGNOSIS — I5022 Chronic systolic (congestive) heart failure: Secondary | ICD-10-CM | POA: Diagnosis present

## 2017-10-20 DIAGNOSIS — Z7952 Long term (current) use of systemic steroids: Secondary | ICD-10-CM

## 2017-10-20 DIAGNOSIS — I252 Old myocardial infarction: Secondary | ICD-10-CM

## 2017-10-20 DIAGNOSIS — Z7901 Long term (current) use of anticoagulants: Secondary | ICD-10-CM

## 2017-10-20 DIAGNOSIS — Z7982 Long term (current) use of aspirin: Secondary | ICD-10-CM

## 2017-10-20 DIAGNOSIS — E785 Hyperlipidemia, unspecified: Secondary | ICD-10-CM | POA: Diagnosis present

## 2017-10-20 DIAGNOSIS — Z86718 Personal history of other venous thrombosis and embolism: Secondary | ICD-10-CM

## 2017-10-20 DIAGNOSIS — I371 Nonrheumatic pulmonary valve insufficiency: Secondary | ICD-10-CM | POA: Diagnosis present

## 2017-10-20 DIAGNOSIS — C9000 Multiple myeloma not having achieved remission: Secondary | ICD-10-CM | POA: Diagnosis present

## 2017-10-20 DIAGNOSIS — I2510 Atherosclerotic heart disease of native coronary artery without angina pectoris: Secondary | ICD-10-CM | POA: Diagnosis present

## 2017-10-20 DIAGNOSIS — B1920 Unspecified viral hepatitis C without hepatic coma: Secondary | ICD-10-CM | POA: Diagnosis present

## 2017-10-20 LAB — COMPREHENSIVE METABOLIC PANEL
ALBUMIN: 3.4 g/dL — AB (ref 3.5–5.0)
ALT: 88 U/L — ABNORMAL HIGH (ref 0–44)
AST: 110 U/L — AB (ref 15–41)
Alkaline Phosphatase: 93 U/L (ref 38–126)
Anion gap: 13 (ref 5–15)
BILIRUBIN TOTAL: 0.9 mg/dL (ref 0.3–1.2)
BUN: 30 mg/dL — ABNORMAL HIGH (ref 8–23)
CHLORIDE: 103 mmol/L (ref 98–111)
CO2: 22 mmol/L (ref 22–32)
Calcium: 9.5 mg/dL (ref 8.9–10.3)
Creatinine, Ser: 1.22 mg/dL (ref 0.61–1.24)
GFR calc Af Amer: >60 mL/min (ref >60)
GFR calc non Af Amer: 59 mL/min — ABNORMAL LOW (ref >60)
GLUCOSE: 313 mg/dL — AB (ref 70–99)
POTASSIUM: 3.3 mmol/L — AB (ref 3.5–5.1)
Sodium: 138 mmol/L (ref 135–145)
Total Protein: 6.2 g/dL — ABNORMAL LOW (ref 6.5–8.1)

## 2017-10-20 LAB — CBC WITH DIFFERENTIAL/PLATELET
Abs Immature Granulocytes: 0.06 10*3/uL (ref 0.00–0.07)
BASOS ABS: 0 10*3/uL (ref 0.0–0.1)
Basophils Relative: 0 %
Eosinophils Absolute: 0.2 10*3/uL (ref 0.0–0.5)
Eosinophils Relative: 3 %
HEMATOCRIT: 33.2 % — AB (ref 39.0–52.0)
HEMOGLOBIN: 11 g/dL — AB (ref 13.0–17.0)
IMMATURE GRANULOCYTES: 1 %
LYMPHS ABS: 2.1 10*3/uL (ref 0.7–4.0)
LYMPHS PCT: 30 %
MCH: 31.1 pg (ref 26.0–34.0)
MCHC: 33.1 g/dL (ref 30.0–36.0)
MCV: 93.8 fL (ref 80.0–100.0)
Monocytes Absolute: 0.7 10*3/uL (ref 0.1–1.0)
Monocytes Relative: 10 %
NEUTROS PCT: 56 %
NRBC: 0.3 % — AB (ref 0.0–0.2)
Neutro Abs: 3.8 10*3/uL (ref 1.7–7.7)
Platelets: 325 10*3/uL (ref 150–400)
RBC: 3.54 MIL/uL — ABNORMAL LOW (ref 4.22–5.81)
RDW: 14 % (ref 11.5–15.5)
WBC: 6.8 10*3/uL (ref 4.0–10.5)

## 2017-10-20 LAB — GLUCOSE, CAPILLARY: GLUCOSE-CAPILLARY: 303 mg/dL — AB (ref 70–99)

## 2017-10-20 LAB — TROPONIN I: Troponin I: 0.04 ng/mL (ref <0.03)

## 2017-10-20 LAB — BRAIN NATRIURETIC PEPTIDE: B Natriuretic Peptide: 299 pg/mL — ABNORMAL HIGH (ref 0.0–100.0)

## 2017-10-20 MED ORDER — ETOMIDATE 2 MG/ML IV SOLN
INTRAVENOUS | Status: AC
  Filled 2017-10-20: qty 10

## 2017-10-20 MED ORDER — ETOMIDATE 2 MG/ML IV SOLN
INTRAVENOUS | Status: AC | PRN
  Administered 2017-10-20 (×2): 5 mg via INTRAVENOUS

## 2017-10-20 MED ORDER — SODIUM CHLORIDE 0.9 % IV BOLUS
500.0000 mL | Freq: Once | INTRAVENOUS | Status: AC
  Administered 2017-10-20: 500 mL via INTRAVENOUS

## 2017-10-20 MED ORDER — ASPIRIN 81 MG PO CHEW
324.0000 mg | CHEWABLE_TABLET | Freq: Once | ORAL | Status: AC
  Administered 2017-10-20: 324 mg via ORAL
  Filled 2017-10-20: qty 4

## 2017-10-20 MED ORDER — MAGNESIUM SULFATE 2 GM/50ML IV SOLN
2.0000 g | Freq: Once | INTRAVENOUS | Status: AC
  Administered 2017-10-20: 2 g via INTRAVENOUS
  Filled 2017-10-20: qty 50

## 2017-10-20 NOTE — ED Triage Notes (Signed)
Pt arrived via ems with chest pain from home. Pt was found to have a heart rate in the 200's. Pt was given 6 of adenosine at 2216, 12 mg at 2218, and 12 mg at 2231. Pt did not convert with adenosine administration. Pt hx of MI, HTN, and DM. Pt A&Ox4.

## 2017-10-20 NOTE — ED Provider Notes (Signed)
[facility] Emergency Department Provider Note  ____________________________________________   First MD Initiated Contact with Patient 10/20/17 2256     (approximate)  I have reviewed the triage vital signs and the nursing notes.   HISTORY  Chief Complaint Chest Pain   HPI Ronnie Andersen is a 67 y.o. male who comes to the emergency department via EMS with chest pain and palpitations.  Symptoms began shortly prior to EMS arrival.  There were mild to moderate in severity and constant.  Nothing seems to make them better or worse.  EMS noted a heart rate in the 200-2 20 range and they thought it was SVT so they gave 6 mg of adenosine then 12 mg then 12 mg again with no change in the heart rhythm.  He had a blood pressure in the 130s in route.  The patient has a past medical history of ischemic cardiomyopathy and his cardiologist is Dr. Saralyn Pilar.his symptoms are now constant moderate severity and nonradiating.    Past Medical History:  Diagnosis Date  . CAD (coronary artery disease)   . Diabetes mellitus without complication ([place])   . HLD (hyperlipidemia)   . Hypertension   . MI (myocardial infarction) [facility])     Patient Active Problem List   Diagnosis Date Noted  . V-tach ([place]) 10/21/2017  . Angina pectoris ([place]) 04/05/2015  . CAD (coronary artery disease) 04/05/2015  . HTN (hypertension) 04/05/2015  . HLD (hyperlipidemia) 04/05/2015  . Type 2 diabetes mellitus ([place]) 04/05/2015    Past Surgical History:  Procedure Laterality Date  . APPENDECTOMY    . BACK SURGERY    . CHOLECYSTECTOMY      Prior to Admission medications   Medication Sig Start Date End Date Taking? Authorizing Provider  apixaban (ELIQUIS) 5 MG TABS tablet TAKE 1 TABLET TWICE DAILY 12/24/16  Yes [provider]  dexamethasone (DECADRON) 4 MG tablet TAKE 3 TABLETS BY MOUTH EVERY FOURTEEN (14) DAYS. TAKE ON TUESDAYS ON WEEKS WHEN NOT COMING FOR DARATUMUMAB INFUSION 08/20/17  Yes  [provider]  finasteride (PROSCAR) 5 MG tablet TAKE 1 TABLET EVERY DAY 07/23/17  Yes [provider]  glipiZIDE (GLUCOTROL) 10 MG tablet Take 10 mg by mouth 2 (two) times daily before a meal.   Yes [provider]  lovastatin (MEVACOR) 20 MG tablet Take 40 mg by mouth at bedtime.   Yes [provider]  magnesium oxide (MAG-OX) 400 MG tablet Take 1 tablet by mouth daily. 09/27/17  Yes [provider]  metFORMIN (GLUCOPHAGE) 1000 MG tablet Take 1,000 mg by mouth 2 (two) times daily with a meal.   Yes [provider]  metoprolol (LOPRESSOR) 50 MG tablet Take 50 mg by mouth 2 (two) times daily.   Yes [provider]  nitroGLYCERIN (NITROSTAT) 0.3 MG SL tablet Place 1 tablet (0.3 mg total) under the tongue every 5 (five) minutes as needed for chest pain. 04/05/15  Yes Henreitta Leber, MD  pomalidomide (POMALYST) 3 MG capsule Take 1 capsule by mouth daily for days 1-21, then 7 days rest 10/14/17  Yes [provider]  tamsulosin (FLOMAX) 0.4 MG CAPS capsule TAKE 1 CAPSULE EVERY DAY 12/17/16  Yes [provider]  valACYclovir (VALTREX) 500 MG tablet Take 500 mg by mouth daily. 10/05/17  Yes [provider]  aspirin EC 81 MG tablet Take 81 mg by mouth daily.    [provider]  lisinopril (PRINIVIL,ZESTRIL) 20 MG tablet Take 20 mg by mouth  daily.    [provider]    Allergies Patient has no known allergies.  Family History  Problem Relation Age of Onset  . Heart attack Father   . Diabetes Father   . Diabetes Mother   . Hypertension Mother     Social History Social History   Tobacco Use  . Smoking status: Never Smoker  Substance Use Topics  . Alcohol use: No    Alcohol/week: 0.0 standard drinks  . Drug use: No    Review of Systems Constitutional: No fever/chills Eyes: No visual changes. ENT: No sore throat. Cardiovascular: Positive for chest pain. Respiratory: Positive for  shortness of breath. Gastrointestinal: No abdominal pain.  No nausea, no vomiting.  No diarrhea.  No constipation. Genitourinary: Negative for dysuria. Musculoskeletal: Negative for back pain. Skin: Negative for rash. Neurological: Negative for headaches, focal weakness or numbness.   ____________________________________________   PHYSICAL EXAM:  VITAL SIGNS: ED Triage Vitals  Enc Vitals Group     BP 10/20/17 2246 95/81     Pulse Rate 10/20/17 2248 (!) 200     Resp 10/20/17 2248 (!) 25     Temp 10/20/17 2248 97.9 F (36.6 C)     Temp src --      SpO2 10/20/17 2248 93 %     Weight 10/20/17 2249 192 lb (87.1 kg)     Height 10/20/17 2249 6\' 6"  (1.981 m)     Head Circumference --      Peak Flow --      Pain Score 10/20/17 2248 0     Pain Loc --      Pain Edu? --      Excl. in [place]? --     Constitutional: Alert and oriented x4 appears somewhat uncomfortable although nontoxic no diaphoresis Eyes: PERRL EOMI. Head: Atraumatic. Nose: No congestion/rhinnorhea. Mouth/Throat: No trismus Neck: No stridor.   Cardiovascular: Tachycardic rate, regular rhythm. Grossly normal heart sounds.  Good peripheral circulation. Respiratory: Increased respiratory effort.  No retractions. Lungs CTAB and moving good air Gastrointestinal: Soft nontender Musculoskeletal: No lower extremity edema   Neurologic:  Normal speech and language. No gross focal neurologic deficits are appreciated. Skin:  Skin is warm, dry and intact. No rash noted. Psychiatric: Mood and affect are normal. Speech and behavior are normal.    ____________________________________________   DIFFERENTIAL includes but not limited to  Ventricular tachycardia, SVT with aberrancy, sinus tachycardia ____________________________________________   LABS (all labs ordered are listed, but only abnormal results are displayed)  Labs Reviewed  COMPREHENSIVE METABOLIC PANEL - Abnormal; Notable for the following components:       Result Value   Potassium 3.3 (*)    Glucose, Bld 313 (*)    BUN 30 (*)    Total Protein 6.2 (*)    Albumin 3.4 (*)    AST 110 (*)    ALT 88 (*)    GFR calc non Af Amer 59 (*)    All other components within normal limits  TROPONIN I - Abnormal; Notable for the following components:   Troponin I 0.04 (*)    All other components within normal limits  BRAIN NATRIURETIC PEPTIDE - Abnormal; Notable for the following components:   B Natriuretic Peptide 299.0 (*)    All other components within normal limits  CBC WITH DIFFERENTIAL/PLATELET - Abnormal; Notable for the following components:   RBC 3.54 (*)    Hemoglobin 11.0 (*)    HCT 33.2 (*)    nRBC 0.3 (*)  All other components within normal limits  GLUCOSE, CAPILLARY - Abnormal; Notable for the following components:   Glucose-Capillary 303 (*)    All other components within normal limits    Lab work reviewed by me shows slightly elevated troponin likely secondary to demand from ventricular tachycardia __________________________________________  EKG  ED ECG REPORT I, Darel Hong, the attending physician, personally viewed and interpreted this ECG.  Date: 10/20/2017 EKG Time: 2248 Rate: 196 Rhythm: v tach QRS Axis: leftward Intervals: prolonged QTc Narrative Interpretation: ventricular tachycardia  ED ECG REPORT I, Darel Hong, the attending physician, personally viewed and interpreted this ECG.  Date: 10/20/2017 EKG Time: 2255 Rate: 102 Rhythm: sinus tach QRS Axis: leftward Intervals: normal Narrative Interpretation: Nonspecific ST changes no signs of acute ischemia  ____________________________________________  RADIOLOGY  Chest x-ray reviewed by me shows mild fluid overload ____________________________________________   PROCEDURES  Procedure(s) performed: Yes  .Critical Care Performed by: Darel Hong, MD Authorized by: Darel Hong, MD   Critical care provider statement:    Critical care  time (minutes):  30   Critical care time was exclusive of:  Separately billable procedures and treating other patients   Critical care was necessary to treat or prevent imminent or life-threatening deterioration of the following conditions:  Cardiac failure   Critical care was time spent personally by me on the following activities:  Development of treatment plan with patient or surrogate, discussions with consultants, evaluation of patient's response to treatment, examination of patient, obtaining history from patient or surrogate, ordering and performing treatments and interventions, ordering and review of laboratory studies, ordering and review of radiographic studies, pulse oximetry, re-evaluation of patient's condition and review of old charts .Cardioversion Date/Time: 10/20/2017 11:06 PM Performed by: Darel Hong, MD Authorized by: Darel Hong, MD   Consent:    Consent obtained:  Verbal   Consent given by:  Patient   Risks discussed:  Induced arrhythmia, pain and cutaneous burn   Alternatives discussed:  Alternative treatment and rate-control medication Pre-procedure details:    Cardioversion basis:  Emergent   Rhythm:  Ventricular tachycardia   Electrode placement:  Anterior-posterior Patient sedated: Yes. Refer to sedation procedure documentation for details of sedation.  Attempt one:    Cardioversion mode:  Synchronous   Waveform:  Biphasic   Shock (Joules):  200   Shock outcome:  Conversion to normal sinus rhythm Post-procedure details:    Patient status:  Awake   Patient tolerance of procedure:  Tolerated well, no immediate complications .Sedation Date/Time: 10/20/2017 11:06 PM Performed by: Darel Hong, MD Authorized by: Darel Hong, MD   Consent:    Consent obtained:  Emergent situation (electronic informed consent)   Consent given by:  Patient   Risks discussed:  Allergic reaction, dysrhythmia, inadequate sedation, nausea, vomiting, respiratory  compromise necessitating ventilatory assistance and intubation, prolonged sedation necessitating reversal and prolonged hypoxia resulting in organ damage Universal protocol:    Procedure explained and questions answered to patient or proxy's satisfaction: yes     Relevant documents present and verified: yes     Test results available and properly labeled: yes     Imaging studies available: yes     Required blood products, implants, devices, and special equipment available: yes     Immediately prior to procedure a time out was called: yes     Patient identity confirmation method:  Arm band Indications:    Procedure performed:  Cardioversion Pre-sedation assessment:    Time since last food or drink:  Unknown  NPO status caution: unable to specify NPO status     ASA classification: class 3 - patient with severe systemic disease     Mouth opening:  3 or more finger widths   Mallampati score:  II - soft palate, uvula, fauces visible   Pre-sedation assessments completed and reviewed: airway patency, cardiovascular function, hydration status, mental status, nausea/vomiting, pain level, respiratory function and temperature     Pre-sedation assessment completed:  10/20/2017 10:56 PM Immediate pre-procedure details:    Reassessment: Patient reassessed immediately prior to procedure     Reviewed: vital signs, relevant labs/tests and NPO status     Verified: bag valve mask available, emergency equipment available, intubation equipment available, IV patency confirmed, oxygen available, reversal medications available and suction available   Procedure details (see MAR for exact dosages):    Preoxygenation:  Nasal cannula   Sedation:  Etomidate   Intra-procedure monitoring:  Blood pressure monitoring, continuous pulse oximetry, cardiac monitor, frequent vital sign checks and frequent LOC assessments   Intra-procedure events: none     Total Provider sedation time (minutes):  5 Post-procedure details:     Post-sedation assessment completed:  10/21/2017 1:04 AM   Attendance: Constant attendance by certified staff until patient recovered     Recovery: Patient returned to pre-procedure baseline     Post-sedation assessments completed and reviewed: airway patency, cardiovascular function, hydration status, mental status and respiratory function     Patient is stable for discharge or admission: yes     Patient tolerance:  Tolerated well, no immediate complications    Critical Care performed: Yes  ____________________________________________   INITIAL IMPRESSION / ASSESSMENT AND PLAN / ED COURSE  Pertinent labs & imaging results that were available during my care of the patient were reviewed by me and considered in my medical decision making (see chart for details).   As part of my medical decision making, I reviewed the following data within the [place] History obtained from family if available, nursing notes, old chart and ekg, as well as notes from prior ED visits.  The patient came to the emergency department with a wide-complex tachycardia most consistent with ventricular tachycardia.  His blood pressure was soft in the 90s so decision was made to electrically cardiovert.  Given 5 mg of etomidate with minimal response so I gave another 5 mg and then synchronized cardioverted with 200 J converting the patient to sinus tachycardia.  I then discussed with his cardiologist Dr. Saralyn Pilar who agreed with inpatient admission however recommended against any further antiarrhythmics at this time given his normal heart rate and blood pressure.  I then discussed with the hospitalist who has graciously agreed to admit the patient to his service.      ____________________________________________   FINAL CLINICAL IMPRESSION(S) / ED DIAGNOSES  Final diagnoses:  Ventricular tachycardia ([place])      NEW MEDICATIONS STARTED DURING THIS VISIT:  New Prescriptions   No medications  on file     Note:  This document was prepared using Dragon voice recognition software and may include unintentional dictation errors.     Darel Hong, MD 10/21/17 [phone]

## 2017-10-21 ENCOUNTER — Inpatient Hospital Stay
Admit: 2017-10-21 | Discharge: 2017-10-21 | Disposition: A | Discharge status: Home / Self Care | Payer: Medicare HMO | Attending: Internal Medicine | Admitting: Internal Medicine

## 2017-10-21 DIAGNOSIS — C9000 Multiple myeloma not having achieved remission: Secondary | ICD-10-CM | POA: Diagnosis present

## 2017-10-21 DIAGNOSIS — I472 Ventricular tachycardia, unspecified: Secondary | ICD-10-CM

## 2017-10-21 DIAGNOSIS — E785 Hyperlipidemia, unspecified: Secondary | ICD-10-CM | POA: Diagnosis present

## 2017-10-21 DIAGNOSIS — I371 Nonrheumatic pulmonary valve insufficiency: Secondary | ICD-10-CM | POA: Diagnosis present

## 2017-10-21 DIAGNOSIS — R079 Chest pain, unspecified: Secondary | ICD-10-CM | POA: Diagnosis present

## 2017-10-21 DIAGNOSIS — I471 Supraventricular tachycardia: Secondary | ICD-10-CM | POA: Diagnosis present

## 2017-10-21 DIAGNOSIS — Z86718 Personal history of other venous thrombosis and embolism: Secondary | ICD-10-CM | POA: Diagnosis not present

## 2017-10-21 DIAGNOSIS — I255 Ischemic cardiomyopathy: Secondary | ICD-10-CM | POA: Diagnosis present

## 2017-10-21 DIAGNOSIS — I5022 Chronic systolic (congestive) heart failure: Secondary | ICD-10-CM | POA: Diagnosis present

## 2017-10-21 DIAGNOSIS — B1920 Unspecified viral hepatitis C without hepatic coma: Secondary | ICD-10-CM | POA: Diagnosis present

## 2017-10-21 DIAGNOSIS — Z7901 Long term (current) use of anticoagulants: Secondary | ICD-10-CM | POA: Diagnosis not present

## 2017-10-21 DIAGNOSIS — Z7952 Long term (current) use of systemic steroids: Secondary | ICD-10-CM | POA: Diagnosis not present

## 2017-10-21 DIAGNOSIS — I252 Old myocardial infarction: Secondary | ICD-10-CM | POA: Diagnosis not present

## 2017-10-21 DIAGNOSIS — I2510 Atherosclerotic heart disease of native coronary artery without angina pectoris: Secondary | ICD-10-CM | POA: Diagnosis present

## 2017-10-21 DIAGNOSIS — Z833 Family history of diabetes mellitus: Secondary | ICD-10-CM | POA: Diagnosis not present

## 2017-10-21 DIAGNOSIS — E876 Hypokalemia: Secondary | ICD-10-CM | POA: Diagnosis present

## 2017-10-21 DIAGNOSIS — E119 Type 2 diabetes mellitus without complications: Secondary | ICD-10-CM | POA: Diagnosis present

## 2017-10-21 DIAGNOSIS — Z7984 Long term (current) use of oral hypoglycemic drugs: Secondary | ICD-10-CM | POA: Diagnosis not present

## 2017-10-21 DIAGNOSIS — I110 Hypertensive heart disease with heart failure: Secondary | ICD-10-CM | POA: Diagnosis present

## 2017-10-21 DIAGNOSIS — M1A9XX0 Chronic gout, unspecified, without tophus (tophi): Secondary | ICD-10-CM | POA: Diagnosis present

## 2017-10-21 DIAGNOSIS — Z8249 Family history of ischemic heart disease and other diseases of the circulatory system: Secondary | ICD-10-CM | POA: Diagnosis not present

## 2017-10-21 DIAGNOSIS — I083 Combined rheumatic disorders of mitral, aortic and tricuspid valves: Secondary | ICD-10-CM | POA: Diagnosis present

## 2017-10-21 DIAGNOSIS — I214 Non-ST elevation (NSTEMI) myocardial infarction: Secondary | ICD-10-CM | POA: Diagnosis present

## 2017-10-21 DIAGNOSIS — Z7982 Long term (current) use of aspirin: Secondary | ICD-10-CM | POA: Diagnosis not present

## 2017-10-21 LAB — CBC
HCT: 28.5 % — ABNORMAL LOW (ref 39.0–52.0)
Hemoglobin: 9.3 g/dL — ABNORMAL LOW (ref 13.0–17.0)
MCH: 30.4 pg (ref 26.0–34.0)
MCHC: 32.6 g/dL (ref 30.0–36.0)
MCV: 93.1 fL (ref 80.0–100.0)
Platelets: 253 10*3/uL (ref 150–400)
RBC: 3.06 MIL/uL — AB (ref 4.22–5.81)
RDW: 14.1 % (ref 11.5–15.5)
WBC: 3.8 10*3/uL — ABNORMAL LOW (ref 4.0–10.5)
nRBC: 0 % (ref 0.0–0.2)

## 2017-10-21 LAB — BASIC METABOLIC PANEL
ANION GAP: 8 (ref 5–15)
BUN: 26 mg/dL — ABNORMAL HIGH (ref 8–23)
CO2: 29 mmol/L (ref 22–32)
Calcium: 8.7 mg/dL — ABNORMAL LOW (ref 8.9–10.3)
Chloride: 103 mmol/L (ref 98–111)
Creatinine, Ser: 1.02 mg/dL (ref 0.61–1.24)
GFR calc Af Amer: >60 mL/min (ref >60)
GLUCOSE: 94 mg/dL (ref 70–99)
Potassium: 2.8 mmol/L — ABNORMAL LOW (ref 3.5–5.1)
Sodium: 140 mmol/L (ref 135–145)

## 2017-10-21 LAB — PROTIME-INR
INR: 1.17
Prothrombin Time: 14.8 seconds (ref 11.4–15.2)

## 2017-10-21 LAB — TROPONIN I
TROPONIN I: 5.09 ng/mL — AB (ref <0.03)
Troponin I: 7.43 ng/mL (ref <0.03)
Troponin I: 3.99 ng/mL (ref <0.03)

## 2017-10-21 LAB — GLUCOSE, CAPILLARY
GLUCOSE-CAPILLARY: 150 mg/dL — AB (ref 70–99)
GLUCOSE-CAPILLARY: 146 mg/dL — AB (ref 70–99)
Glucose-Capillary: 156 mg/dL — ABNORMAL HIGH (ref 70–99)
Glucose-Capillary: 80 mg/dL (ref 70–99)
Glucose-Capillary: 226 mg/dL — ABNORMAL HIGH (ref 70–99)

## 2017-10-21 LAB — PHOSPHORUS: Phosphorus: 3.2 mg/dL (ref 2.5–4.6)

## 2017-10-21 LAB — APTT
APTT: 33 s (ref 24–36)
aPTT: 27 seconds (ref 24–36)

## 2017-10-21 LAB — HEPARIN LEVEL (UNFRACTIONATED): HEPARIN UNFRACTIONATED: 0.85 [IU]/mL — AB (ref 0.30–0.70)

## 2017-10-21 LAB — MAGNESIUM: MAGNESIUM: 1.9 mg/dL (ref 1.7–2.4)

## 2017-10-21 MED ORDER — BISACODYL 5 MG PO TBEC
5.0000 mg | DELAYED_RELEASE_TABLET | Freq: Every day | ORAL | Status: DC | PRN
  Administered 2017-10-21: 5 mg via ORAL
  Filled 2017-10-21: qty 1

## 2017-10-21 MED ORDER — FINASTERIDE 5 MG PO TABS
5.0000 mg | ORAL_TABLET | Freq: Every day | ORAL | Status: DC
  Administered 2017-10-21 – 2017-10-23 (×3): 5 mg via ORAL
  Filled 2017-10-21 (×3): qty 1

## 2017-10-21 MED ORDER — ONDANSETRON HCL 4 MG PO TABS: 4.0000 mg | ORAL_TABLET | Freq: Four times a day (QID) | ORAL | Status: DC | PRN

## 2017-10-21 MED ORDER — HYDROCODONE-ACETAMINOPHEN 5-325 MG PO TABS: 1.0000 | ORAL_TABLET | ORAL | Status: DC | PRN

## 2017-10-21 MED ORDER — METOPROLOL TARTRATE 50 MG PO TABS
50.0000 mg | ORAL_TABLET | Freq: Two times a day (BID) | ORAL | Status: DC
  Administered 2017-10-21 – 2017-10-22 (×3): 50 mg via ORAL
  Filled 2017-10-21 (×3): qty 1

## 2017-10-21 MED ORDER — HEPARIN BOLUS VIA INFUSION
4000.0000 [IU] | Freq: Once | INTRAVENOUS | Status: AC
  Administered 2017-10-21: 4000 [IU] via INTRAVENOUS
  Filled 2017-10-21: qty 4000

## 2017-10-21 MED ORDER — POTASSIUM CHLORIDE 10 MEQ/100ML IV SOLN
10.0000 meq | INTRAVENOUS | Status: AC
  Administered 2017-10-21 (×3): 10 meq via INTRAVENOUS
  Filled 2017-10-21 (×4): qty 100

## 2017-10-21 MED ORDER — TRAZODONE HCL 50 MG PO TABS: 25.0000 mg | ORAL_TABLET | Freq: Every evening | ORAL | Status: DC | PRN

## 2017-10-21 MED ORDER — INSULIN ASPART 100 UNIT/ML ~~LOC~~ SOLN
0.0000 [IU] | Freq: Every day | SUBCUTANEOUS | Status: DC
  Administered 2017-10-21: 2 [IU] via SUBCUTANEOUS
  Filled 2017-10-21: qty 1

## 2017-10-21 MED ORDER — SODIUM CHLORIDE 0.9 % IV SOLN
Freq: Once | INTRAVENOUS | Status: AC
  Administered 2017-10-21: 03:00:00 via INTRAVENOUS

## 2017-10-21 MED ORDER — HEPARIN SODIUM (PORCINE) 5000 UNIT/ML IJ SOLN: 5000.0000 [IU] | Freq: Three times a day (TID) | INTRAMUSCULAR | Status: DC

## 2017-10-21 MED ORDER — INSULIN ASPART 100 UNIT/ML ~~LOC~~ SOLN
0.0000 [IU] | Freq: Three times a day (TID) | SUBCUTANEOUS | Status: DC
  Administered 2017-10-21 (×2): 1 [IU] via SUBCUTANEOUS
  Administered 2017-10-22 – 2017-10-23 (×4): 2 [IU] via SUBCUTANEOUS
  Filled 2017-10-21 (×6): qty 1

## 2017-10-21 MED ORDER — ACETAMINOPHEN 325 MG PO TABS
650.0000 mg | ORAL_TABLET | Freq: Four times a day (QID) | ORAL | Status: DC | PRN
  Administered 2017-10-23: 650 mg via ORAL
  Filled 2017-10-21: qty 2

## 2017-10-21 MED ORDER — HEPARIN BOLUS VIA INFUSION
2300.0000 [IU] | Freq: Once | INTRAVENOUS | Status: AC
  Administered 2017-10-21: 2300 [IU] via INTRAVENOUS
  Filled 2017-10-21: qty 2300

## 2017-10-21 MED ORDER — HEPARIN (PORCINE) IN NACL 100-0.45 UNIT/ML-% IJ SOLN
1450.0000 [IU]/h | INTRAMUSCULAR | Status: DC
  Administered 2017-10-21: 900 [IU]/h via INTRAVENOUS
  Filled 2017-10-21 (×3): qty 250

## 2017-10-21 MED ORDER — TAMSULOSIN HCL 0.4 MG PO CAPS
0.4000 mg | ORAL_CAPSULE | Freq: Every day | ORAL | Status: DC
  Administered 2017-10-21 – 2017-10-23 (×3): 0.4 mg via ORAL
  Filled 2017-10-21 (×3): qty 1

## 2017-10-21 MED ORDER — LISINOPRIL 20 MG PO TABS
20.0000 mg | ORAL_TABLET | Freq: Every day | ORAL | Status: DC
  Administered 2017-10-23: 20 mg via ORAL
  Filled 2017-10-21 (×3): qty 1

## 2017-10-21 MED ORDER — SALINE SPRAY 0.65 % NA SOLN
1.0000 | NASAL | Status: DC | PRN
  Filled 2017-10-21: qty 44

## 2017-10-21 MED ORDER — ACETAMINOPHEN 650 MG RE SUPP: 650.0000 mg | Freq: Four times a day (QID) | RECTAL | Status: DC | PRN

## 2017-10-21 MED ORDER — PRAVASTATIN SODIUM 20 MG PO TABS
20.0000 mg | ORAL_TABLET | Freq: Every day | ORAL | Status: DC
  Administered 2017-10-21 – 2017-10-22 (×2): 20 mg via ORAL
  Filled 2017-10-21 (×2): qty 1

## 2017-10-21 MED ORDER — DOCUSATE SODIUM 100 MG PO CAPS
100.0000 mg | ORAL_CAPSULE | Freq: Two times a day (BID) | ORAL | Status: DC
  Administered 2017-10-21 – 2017-10-22 (×3): 100 mg via ORAL
  Filled 2017-10-21 (×4): qty 1

## 2017-10-21 MED ORDER — ONDANSETRON HCL 4 MG/2ML IJ SOLN: 4.0000 mg | Freq: Four times a day (QID) | INTRAMUSCULAR | Status: DC | PRN

## 2017-10-21 MED ORDER — VALACYCLOVIR HCL 500 MG PO TABS
500.0000 mg | ORAL_TABLET | Freq: Every day | ORAL | Status: DC
  Administered 2017-10-21 – 2017-10-23 (×3): 500 mg via ORAL
  Filled 2017-10-21 (×3): qty 1

## 2017-10-21 MED ORDER — MAGNESIUM OXIDE 400 (241.3 MG) MG PO TABS
400.0000 mg | ORAL_TABLET | Freq: Every day | ORAL | Status: DC
  Administered 2017-10-21 – 2017-10-23 (×3): 400 mg via ORAL
  Filled 2017-10-21 (×3): qty 1

## 2017-10-21 MED ORDER — PANTOPRAZOLE SODIUM 40 MG PO TBEC
40.0000 mg | DELAYED_RELEASE_TABLET | Freq: Every day | ORAL | Status: DC
  Administered 2017-10-22 – 2017-10-23 (×2): 40 mg via ORAL
  Filled 2017-10-21 (×3): qty 1

## 2017-10-21 MED ORDER — AMIODARONE HCL 200 MG PO TABS
400.0000 mg | ORAL_TABLET | Freq: Three times a day (TID) | ORAL | Status: DC
  Administered 2017-10-21 – 2017-10-23 (×6): 400 mg via ORAL
  Filled 2017-10-21 (×7): qty 2

## 2017-10-21 MED ORDER — APIXABAN 5 MG PO TABS: 5.0000 mg | ORAL_TABLET | Freq: Two times a day (BID) | ORAL | Status: DC

## 2017-10-21 MED ORDER — NITROGLYCERIN 0.4 MG SL SUBL: 0.4000 mg | SUBLINGUAL_TABLET | SUBLINGUAL | Status: DC | PRN

## 2017-10-21 MED ORDER — MAGNESIUM SULFATE 2 GM/50ML IV SOLN
2.0000 g | Freq: Once | INTRAVENOUS | Status: AC
  Administered 2017-10-21: 2 g via INTRAVENOUS
  Filled 2017-10-21: qty 50

## 2017-10-21 MED ORDER — ASPIRIN EC 81 MG PO TBEC
81.0000 mg | DELAYED_RELEASE_TABLET | Freq: Every day | ORAL | Status: DC
  Administered 2017-10-21 – 2017-10-22 (×2): 81 mg via ORAL
  Filled 2017-10-21 (×3): qty 1

## 2017-10-21 NOTE — Progress Notes (Signed)
Pharmacy Electrolyte Monitoring Consult:  Pharmacy consulted to assist in monitoring and replacing electrolytes in this 67 y.o. male admitted on 10/20/2017 with Chest Pain   Labs:  Sodium (mmol/L)  Date Value  10/20/2017 138   Potassium (mmol/L)  Date Value  10/20/2017 3.3 (L)   Calcium (mg/dL)  Date Value  10/20/2017 9.5   Albumin (g/dL)  Date Value  10/20/2017 3.4 (L)    Assessment/Plan: Will replace K+ w/ KCl 10 mEq IV x 4 and will recheck w/ am labs. Will add on mag and phos and will assess.  Tobie Lords, PharmD, BCPS Clinical Pharmacist 10/21/2017

## 2017-10-21 NOTE — Progress Notes (Signed)
CRITICAL VALUE ALERT  Critical Value:  Troponin 7.43  Date & Time Notied:  10/21/17.1038  Provider Notified: Clabe Seal, PA   Orders Received/Actions taken: n/a

## 2017-10-21 NOTE — Plan of Care (Signed)
Rounded with MD, pt currently on Heparin gtt 14ml/hr. No complains of pain during this shift, pt resting in room comfortably, patient weaned off O2 now on R/A SPO2 95% .  Problem: Education: Goal: Knowledge of General Education information will improve Description Including pain rating scale, medication(s)/side effects and non-pharmacologic comfort measures Outcome: Progressing   Problem: Clinical Measurements: Goal: Cardiovascular complication will be avoided Outcome: Progressing   Problem: Safety: Goal: Ability to remain free from injury will improve Outcome: Progressing   Problem: Cardiac: Goal: Ability to achieve and maintain adequate cardiopulmonary perfusion will improve Outcome: Progressing

## 2017-10-21 NOTE — Progress Notes (Signed)
Initial Nutrition Assessment  DOCUMENTATION CODES:   Not applicable  INTERVENTION:  Pt denied ONS at this time Monitor for adequate PO intake, snacks BM   NUTRITION DIAGNOSIS:   Inadequate oral intake related to chronic illness(CAD, w/ EF 30%, multiple myleoma) as evidenced by energy intake < 75% for > or equal to 1 month, per patient/family report.   GOAL:   Patient will meet greater than or equal to 90% of their needs    MONITOR:   PO intake, Weight trends  REASON FOR ASSESSMENT:   Malnutrition Screening Tool    ASSESSMENT:   67yo pt w/ multiple myleoma who arrived by EMS w/ chest pain and  palpitations on 10/13; hospital admit  for evaluation d/t cardiomegaly w/ mild interstitial edema. Recent echo reports EF 30%. PMH: CAD, T2DM, HLD, HTN, MI, Cholecystectomy, Appendectomy.    Pt reading Bible w/ wife and daughter at bedside. Pt reports feeling ok and had recently placed dinner order of meatloaf and green beans. Pt reports eating 2 meals/day plus snacks; breakfast: eggs, bacon or bowl of oatmeal and dinner patients wife stated preparing  sandwiches or having leftovers from the previous evening.   Pt reports recent improvements to appetite and being able to taste food. Pt is currently undergoing chemo 1x/month and reported taste alterations when undergoing radiation. Pt reported occasionally using ONS to increase PO intake, but did not care for them.   Pt was not interested in having supplements during length of stay and stated he would just order them if he changed his mind.  Medications: Colace, novoLog 0-9units w/ meals, protonix Labs: K 2.8 (L) - replacing           Ca corrects for low albumin   NUTRITION - FOCUSED PHYSICAL EXAM:    Most Recent Value  Orbital Region  Moderate depletion  Upper Arm Region  Mild depletion  Thoracic and Lumbar Region  Mild depletion  Buccal Region  Moderate depletion  Temple Region  Mild depletion  Clavicle Bone Region  Mild  depletion  Clavicle and Acromion Bone Region  No depletion  Scapular Bone Region  No depletion  Dorsal Hand  Mild depletion  Patellar Region  Unable to assess  Anterior Thigh Region  Unable to assess  Posterior Calf Region  Unable to assess  Edema (RD Assessment)  None  Hair  Reviewed  Eyes  Reviewed  Mouth  Reviewed  Skin  Reviewed  Nails  Reviewed       Diet Order:   Diet Order            Diet heart healthy/carb modified Room service appropriate? Yes; Fluid consistency: Thin  Diet effective now              EDUCATION NEEDS:   No education needs have been identified at this time  Skin:  Skin Assessment: Reviewed RN Assessment(pale; dry)  Last BM:  10/13 WDL  Height:   Ht Readings from Last 1 Encounters:  10/21/17 6\' 6"  (1.981 m)    Weight:   Wt Readings from Last 1 Encounters:  10/21/17 77.1 kg    Ideal Body Weight:     BMI:  Body mass index is 19.63 kg/m.  Estimated Nutritional Needs:   Kcal:  3734-2876  Protein:  117-130  Fluid:  2.5L    Lajuan Lines, RD, LDN  After Hours/Weekend Pager: [phone]

## 2017-10-21 NOTE — Plan of Care (Signed)
  Problem: Education: Goal: Knowledge of General Education information will improve Description Including pain rating scale, medication(s)/side effects and non-pharmacologic comfort measures Outcome: Progressing   Problem: Clinical Measurements: Goal: Diagnostic test results will improve Outcome: Progressing Goal: Cardiovascular complication will be avoided Outcome: Progressing   Problem: Activity: Goal: Ability to tolerate increased activity will improve Outcome: Progressing   Problem: Cardiac: Goal: Ability to achieve and maintain adequate cardiopulmonary perfusion will improve Outcome: Progressing

## 2017-10-21 NOTE — Consult Note (Signed)
ANTICOAGULATION CONSULT NOTE - Initial Consult  Pharmacy Consult for Heparin infusion Indication: chest pain/ACS  No Known Allergies  Patient Measurements: Height: '6\' 6"'  (198.1 cm) Weight: 169 lb 14.4 oz (77.1 kg) IBW/kg (Calculated) : 91.4 Heparin Dosing Weight: 77.1  Vital Signs: Temp: 98.3 F (36.8 C) (10/14 1915) Temp Source: Oral (10/14 1915) BP: 96/69 (10/14 1915) Pulse Rate: 72 (10/14 1915)  Labs: Recent Labs    10/20/17 2249 10/21/17 0546 10/21/17 0917 10/21/17 1215 10/21/17 1504 10/21/17 1853  HGB 11.0* 9.3*  --   --   --   --   HCT 33.2* 28.5*  --   --   --   --   PLT 325 253  --   --   --   --   APTT  --   --   --  27  --  33  LABPROT  --   --   --  14.8  --   --   INR  --   --   --  1.17  --   --   HEPARINUNFRC  --   --   --  0.85*  --   --   CREATININE 1.22 1.02  --   --   --   --   TROPONINI 0.04*  --  7.43*  --  5.09* 3.99*    Estimated Creatinine Clearance: 75.6 mL/min (by C-G formula based on SCr of 1.02 mg/dL).   Medical History: Past Medical History:  Diagnosis Date  . CAD (coronary artery disease)   . Diabetes mellitus without complication ([place])   . HLD (hyperlipidemia)   . Hypertension   . MI (myocardial infarction) ([place])     Medications:  Medications Prior to Admission  Medication Sig Dispense Refill Last Dose  . apixaban (ELIQUIS) 5 MG TABS tablet TAKE 1 TABLET TWICE DAILY   10/20/2017 at 1700  . dexamethasone (DECADRON) 4 MG tablet TAKE 3 TABLETS BY MOUTH EVERY FOURTEEN (14) DAYS. TAKE ON TUESDAYS ON WEEKS WHEN NOT COMING FOR DARATUMUMAB INFUSION  3 prn at prn  . finasteride (PROSCAR) 5 MG tablet TAKE 1 TABLET EVERY DAY   10/20/2017 at Unknown time  . glipiZIDE (GLUCOTROL) 10 MG tablet Take 10 mg by mouth 2 (two) times daily before a meal.   10/20/2017 at 1700  . lovastatin (MEVACOR) 20 MG tablet Take 40 mg by mouth at bedtime.   10/20/2017 at 1700  . magnesium oxide (MAG-OX) 400 MG tablet Take 1 tablet by mouth daily.  6 10/20/2017  at 0730  . metFORMIN (GLUCOPHAGE) 1000 MG tablet Take 1,000 mg by mouth 2 (two) times daily with a meal.   10/20/2017 at 1700  . metoprolol (LOPRESSOR) 50 MG tablet Take 50 mg by mouth 2 (two) times daily.   10/20/2017 at 1700  . nitroGLYCERIN (NITROSTAT) 0.3 MG SL tablet Place 1 tablet (0.3 mg total) under the tongue every 5 (five) minutes as needed for chest pain. 90 tablet 2 prn at prn  . pomalidomide (POMALYST) 3 MG capsule Take 1 capsule by mouth daily for days 1-21, then 7 days rest   10/20/2017 at 1700  . tamsulosin (FLOMAX) 0.4 MG CAPS capsule TAKE 1 CAPSULE EVERY DAY   10/20/2017 at 0730  . valACYclovir (VALTREX) 500 MG tablet Take 500 mg by mouth daily.  11 10/20/2017 at 0730  . aspirin EC 81 MG tablet Take 81 mg by mouth daily.   Not Taking at Unknown time  . lisinopril (PRINIVIL,ZESTRIL) 20 MG  tablet Take 20 mg by mouth daily.   Not Taking at Unknown time    Assessment: 67 year old male referred for evaluation of ventricular tachycardia.  The patient has a known history of ischemic cardiomyopathy with LVEF 25 to 30%, coronary artery disease, status post NSTEMI with occluded LAD in 2001, chronically occluded LAD with collaterals by cardiac catheterization in 1031, chronic systolic congestive heart failure, essential hypertension, hyperlipidemia, type 2 diabetes, history of DVT on Eliquis, and multiple myeloma, currently undergoing once monthly chemotherapy. 2nd troponin elevated to 7.43.  Currently on Heparin 900 units/hr, 18:30 aPTT resulted at 33 seconds  Goal of Therapy:  Heparin level 0.3-0.7 units/ml aPTT 66-102 seconds Monitor platelets by anticoagulation protocol: Yes   Plan:  Give 2300 units bolus x 1 increase heparin infusion to 1100 units/hr Check aPTT every 6 hours until aPTT and heparin level correlate with therapeutic level and heparin level daily while on heparin. Continue to monitor H&H and platelets  Paulina Fusi, PharmD, BCPS 10/21/2017 7:56 PM

## 2017-10-21 NOTE — Progress Notes (Signed)
*  PRELIMINARY RESULTS* Echocardiogram 2D Echocardiogram has been performed.  Ronnie Andersen 10/21/2017, 3:17 PM

## 2017-10-21 NOTE — Plan of Care (Signed)
  Problem: Health Behavior/Discharge Planning: Goal: Ability to manage health-related needs will improve Outcome: Progressing   Problem: Clinical Measurements: Goal: Cardiovascular complication will be avoided Outcome: Progressing   Problem: Cardiac: Goal: Ability to achieve and maintain adequate cardiopulmonary perfusion will improve Outcome: Progressing

## 2017-10-21 NOTE — Consult Note (Signed)
[facility]  CARDIOLOGY CONSULT NOTE  Patient ID: Ronnie Andersen MRN: [id number] DOB/AGE: 1950/05/08 67 y.o.  Admit date: 10/20/2017 Referring Physician Vianne Bulls  Primary Physician [facility] Primary Cardiologist [place] Reason for Consultation Ventricular tachycardia  HPI: 67 year old male referred for evaluation of ventricular tachycardia.  The patient has a known history of ischemic cardiomyopathy with LVEF 25 to 30%, coronary artery disease, status post NSTEMI with occluded LAD in 2001, chronically occluded LAD with collaterals by cardiac catheterization in 5885, chronic systolic congestive heart failure, essential hypertension, hyperlipidemia, type 2 diabetes, history of DVT on Eliquis, and multiple myeloma, currently undergoing once monthly chemotherapy. The patient was in his usual state of health until yesterday evening when he acutely developed lightheadedness and a feeling as if he would pass out with acute onset of shortness of breath.  EMS was called and the patient's heart rate was noted to be greater than 200.  He received adenosine 6 mg and 12 mg without change in rhythm.  In the emergency room, the patient underwent successful synchronized cardioversion.  Admission labs notable for potassium 3.3, now 2.8, and initial troponin 0.04.  Chest x-ray revealed cardiomegaly with mild interstitial edema.  The patient denied experiencing chest pain during this recent episode.  He denies chest pain now, shortness of breath, or palpitations.  He denies recurrent lightheadedness or presyncope. 2D echocardiogram on 07/05/2017 revealed severely decreased left ventricular function, with LVEF 25 to 30%, with mild to moderate mitral regurgitation, and mild aortic, tricuspid, and pulmonic regurgitation. The patient was evaluated by Dr. Mylinda Latina for candidacy of ICD. The patient was scheduled for surgery to take place on 07/19/2017, but he canceled the procedure and has deferred at this time.  ETT sestamibi study 04/05/2015 revealed anterior scar without ischemia.   Review of systems complete and found to be negative unless listed above     Past Medical History:  Diagnosis Date  . CAD (coronary artery disease)   . Diabetes mellitus without complication ([place])   . HLD (hyperlipidemia)   . Hypertension   . MI (myocardial infarction) [facility])     Past Surgical History:  Procedure Laterality Date  . APPENDECTOMY    . BACK SURGERY    . CHOLECYSTECTOMY      Medications Prior to Admission  Medication Sig Dispense Refill Last Dose  . apixaban (ELIQUIS) 5 MG TABS tablet TAKE 1 TABLET TWICE DAILY   10/20/2017 at 1700  . dexamethasone (DECADRON) 4 MG tablet TAKE 3 TABLETS BY MOUTH EVERY FOURTEEN (14) DAYS. TAKE ON TUESDAYS ON WEEKS WHEN NOT COMING FOR DARATUMUMAB INFUSION  3 prn at prn  . finasteride (PROSCAR) 5 MG tablet TAKE 1 TABLET EVERY DAY   10/20/2017 at Unknown time  . glipiZIDE (GLUCOTROL) 10 MG tablet Take 10 mg by mouth 2 (two) times daily before a meal.   10/20/2017 at 1700  . lovastatin (MEVACOR) 20 MG tablet Take 40 mg by mouth at bedtime.   10/20/2017 at 1700  . magnesium oxide (MAG-OX) 400 MG tablet Take 1 tablet by mouth daily.  6 10/20/2017 at 0730  . metFORMIN (GLUCOPHAGE) 1000 MG tablet Take 1,000 mg by mouth 2 (two) times daily with a meal.   10/20/2017 at 1700  . metoprolol (LOPRESSOR) 50 MG tablet Take 50 mg by mouth 2 (two) times daily.   10/20/2017 at 1700  . nitroGLYCERIN (NITROSTAT) 0.3 MG SL tablet Place 1 tablet (0.3 mg total) under the tongue every 5 (five) minutes as needed for chest  pain. 90 tablet 2 prn at prn  . pomalidomide (POMALYST) 3 MG capsule Take 1 capsule by mouth daily for days 1-21, then 7 days rest   10/20/2017 at 1700  . tamsulosin (FLOMAX) 0.4 MG CAPS capsule TAKE 1 CAPSULE EVERY DAY   10/20/2017 at 0730  . valACYclovir (VALTREX) 500 MG tablet Take 500 mg by mouth daily.  11 10/20/2017 at 0730  . aspirin EC 81 MG tablet Take 81 mg by mouth  daily.   Not Taking at Unknown time  . lisinopril (PRINIVIL,ZESTRIL) 20 MG tablet Take 20 mg by mouth daily.   Not Taking at Unknown time   Social History   Socioeconomic History  . Marital status: Married    Spouse name: Not on file  . Number of children: Not on file  . Years of education: Not on file  . Highest education level: Not on file  Occupational History  . Not on file  Social Needs  . Financial resource strain: Not on file  . Food insecurity:    Worry: Not on file    Inability: Not on file  . Transportation needs:    Medical: Not on file    Non-medical: Not on file  Tobacco Use  . Smoking status: Never Smoker  Substance and Sexual Activity  . Alcohol use: No    Alcohol/week: 0.0 standard drinks  . Drug use: No  . Sexual activity: Not on file  Lifestyle  . Physical activity:    Days per week: Not on file    Minutes per session: Not on file  . Stress: Not on file  Relationships  . Social connections:    Talks on phone: Not on file    Gets together: Not on file    Attends religious service: Not on file    Active member of club or organization: Not on file    Attends meetings of clubs or organizations: Not on file    Relationship status: Not on file  . Intimate partner violence:    Fear of current or ex partner: Not on file    Emotionally abused: Not on file    Physically abused: Not on file    Forced sexual activity: Not on file  Other Topics Concern  . Not on file  Social History Narrative  . Not on file    Family History  Problem Relation Age of Onset  . Heart attack Father   . Diabetes Father   . Diabetes Mother   . Hypertension Mother       Review of systems complete and found to be negative unless listed above      PHYSICAL EXAM  General: Well developed, well nourished, in no acute distress HEENT:  Normocephalic and atramatic Neck:  No JVD.  Lungs: Clear bilaterally to auscultation, normal effort of breathing Heart: HRRR . Normal S1  and S2 without gallops or murmurs.  Abdomen: Nondistended Msk:  Back normal, gait not assessed.  Normal strength and tone for age. Extremities: No clubbing, cyanosis or edema.   Neuro: Alert and oriented X 3. Psych:  Good affect, responds appropriately  Labs:   Lab Results  Component Value Date   WBC 3.8 (L) 10/21/2017   HGB 9.3 (L) 10/21/2017   HCT 28.5 (L) 10/21/2017   MCV 93.1 10/21/2017   PLT 253 10/21/2017    Recent Labs  Lab 10/20/17 2249 10/21/17 0546  NA 138 140  K 3.3* 2.8*  CL 103 103  CO2 22 29  BUN 30* 26*  CREATININE 1.22 1.02  CALCIUM 9.5 8.7*  PROT 6.2*  --   BILITOT 0.9  --   ALKPHOS 93  --   ALT 88*  --   AST 110*  --   GLUCOSE 313* 94   Lab Results  Component Value Date   TROPONINI 0.04 ([place]) 10/20/2017   No results found for: CHOL No results found for: HDL No results found for: LDLCALC No results found for: TRIG No results found for: CHOLHDL No results found for: LDLDIRECT    Radiology: Dg Chest Port 1 View  Result Date: 10/20/2017 CLINICAL DATA:  Ventricular tachycardia. EXAM: PORTABLE CHEST 1 VIEW COMPARISON:  04/04/2015 FINDINGS: Cardiomegaly with tortuous nonaneurysmal thoracic aorta. Mild interstitial edema with bibasilar atelectasis. No effusion or pneumothorax. Remote left posterior fifth rib fracture, stable in appearance. IMPRESSION: Cardiomegaly with mild interstitial edema since prior. Electronically Signed   By: Ashley Royalty M.D.   On: 10/20/2017 23:37    EKG: Sinus rhythm, rate 78 bpm  ASSESSMENT AND PLAN:  1.  Ventricular tachycardia, status post successful synchronized cardioversion x1, now in sinus rhythm 2.  Known ischemic cardiomyopathy with LVEF 25 to 30%.  The patient was evaluated by Dr. Mylinda Latina for candidacy of ICD.  He was scheduled for surgery to take place in July 2019, but he canceled the procedure and had deferred at the time of his last office visit last month. 3.  Multiple myeloma, currently undergoing once  monthly chemotherapy, stable 4.  Known coronary artery disease, status post NSTEMI 2001, with chronically occluded LAD with collaterals per cardiac catheterization in 2003, currently without chest pain.  Initial troponin 0.04. 5. History of DVT, on Eliquis; last dose was last night.  Recommendations: 1. Start PO amiodarone 400 mg three times daily 2. Hold Eliquis in anticipation of cardiac catheterization to take place on Wednesday morning 3. 2D echocardiogram 4. Will plan to transfer to [facility] on Thursday for AICD implantation on Friday 5. Trend cardiac enzymes  Signed: Clabe Seal PA-C 10/21/2017, 9:42 AM  The above patient encounter was discussed in depth with Dr. Saralyn Pilar and Dr. Mylinda Latina and the plan was made in collaboration with them.

## 2017-10-21 NOTE — Progress Notes (Addendum)
Admitted this morning for chest pain and found to have V. tach with heart rate up to 202 to 20 bpm as per EMS, patient was cardioverted emergently in the emergency room with 200 J of synchronized cardioversion.  Patient feels well this morning and denies any chest pain or shortness of breath, heart rate is around 92 to 100 bpm.  Seen by cardiology, Lab data reviewed Assessment and plan 1.  Sustained wide-complex ventricular tachycardia: Resolved with cardioversion, continue to monitor on telemetry, follow echocardiogram,  possible cardiac cath Wednesday morning, possible transfer to [place] on Thursday for AICD implantation on Friday, continue p.o. amiodarone.started by cardiology. 2.  CAD: Continue statins, beta-blockers, ACE inhibitors.,  Patient has history of chronic systolic heart failure with EF of 30%.  Clinically compensated. 3.  Diabetes mellitus type 2: Patient is on Glucotrol 10 mg daily, if the patient is n.p.o. after midnight hold the morning dose and continue sliding scale with coverage #4 hypokalemia, hypomagnesemia: Replace, recheck tomorrow. 5.  Non-ST elevation MI with elevated troponins likely due to cardioversion but because of underlying CAD cardiology started him on heparin drip, possible cardiac cath on Wednesday  discussed with cardiology PA,  Time spent 15 minutes.

## 2017-10-21 NOTE — Progress Notes (Signed)
2nd troponin elevated to 7.43. Will start heparin drip per pharmacy  Continue to hold Eliquis.

## 2017-10-21 NOTE — H&P (Signed)
Ronnie Andersen NAME: Ronnie Andersen    MR#:  [id number]  DATE OF BIRTH:  05/12/1950  DATE OF ADMISSION:  10/20/2017  PRIMARY CARE PHYSICIAN: System, Pcp Not In   REQUESTING/REFERRING PHYSICIAN:   CHIEF COMPLAINT:   Chief Complaint  Patient presents with  . Chest Pain    HISTORY OF PRESENT ILLNESS: Ronnie Andersen  is a 67 y.o. male with a known history of coronary artery disease, diabetes, hypertension, MI.  Ejection fraction per most recent echo is around 30%. Patient was brought to emergency room for acute onset of moderate to severe retrosternal chest pain and palpitations.  Symptoms began shortly prior to EMS arrival.  Per EMS records initial heart rate noted was 200-220, SVT per monitor.  Patient received 6 mg adenosine, then 12 mg and another 12 mg without any improvement. At the arrival to emergency room, patient's heart rate was 130 with wide-complex V. tach per monitor.  Patient underwent successful synchronized cardioversion with 200 J. At this time, the tele-monitor shows normal sinus rhythm with heart rate at 70. Blood test done emergency room are remarkable for potassium level of 3.3.  BNP is 299.  Chest x-ray shows cardiomegaly with mild interstitial edema. Patient is admitted for further evaluation and management.   PAST MEDICAL HISTORY:   Past Medical History:  Diagnosis Date  . CAD (coronary artery disease)   . Diabetes mellitus without complication ([place])   . HLD (hyperlipidemia)   . Hypertension   . MI (myocardial infarction) ([place])     PAST SURGICAL HISTORY:  Past Surgical History:  Procedure Laterality Date  . APPENDECTOMY    . BACK SURGERY    . CHOLECYSTECTOMY      SOCIAL HISTORY:  Social History   Tobacco Use  . Smoking status: Never Smoker  Substance Use Topics  . Alcohol use: No    Alcohol/week: 0.0 standard drinks    FAMILY HISTORY:  Family History  Problem Relation Age of Onset  . Heart  attack Father   . Diabetes Father   . Diabetes Mother   . Hypertension Mother     DRUG ALLERGIES: No Known Allergies  REVIEW OF SYSTEMS:   CONSTITUTIONAL: No fever, fatigue or weakness.  EYES: No changes in vision.  EARS, NOSE, AND THROAT: No tinnitus or ear pain.  RESPIRATORY: No cough, shortness of breath, wheezing or hemoptysis.  CARDIOVASCULAR: Positive for chest pain and palpitations, resolved status post cardioversion.  GASTROINTESTINAL: No nausea, vomiting, diarrhea or abdominal pain.  GENITOURINARY: No dysuria, hematuria.  ENDOCRINE: No polyuria, nocturia. HEMATOLOGY: No bleeding. SKIN: No rash or lesion. MUSCULOSKELETAL: No joint pain at this time.   NEUROLOGIC: No focal weakness.  PSYCHIATRY: No anxiety or depression.   MEDICATIONS AT HOME:  Prior to Admission medications   Medication Sig Start Date End Date Taking? Authorizing Provider  apixaban (ELIQUIS) 5 MG TABS tablet TAKE 1 TABLET TWICE DAILY 12/24/16  Yes [provider]  dexamethasone (DECADRON) 4 MG tablet TAKE 3 TABLETS BY MOUTH EVERY FOURTEEN (14) DAYS. TAKE ON TUESDAYS ON WEEKS WHEN NOT COMING FOR DARATUMUMAB INFUSION 08/20/17  Yes [provider]  finasteride (PROSCAR) 5 MG tablet TAKE 1 TABLET EVERY DAY 07/23/17  Yes [provider]  glipiZIDE (GLUCOTROL) 10 MG tablet Take 10 mg by mouth 2 (two) times daily before a meal.   Yes [provider]  lovastatin (MEVACOR) 20 MG tablet Take 40 mg by mouth at bedtime.  Yes [provider]  magnesium oxide (MAG-OX) 400 MG tablet Take 1 tablet by mouth daily. 09/27/17  Yes [provider]  metFORMIN (GLUCOPHAGE) 1000 MG tablet Take 1,000 mg by mouth 2 (two) times daily with a meal.   Yes [provider]  metoprolol (LOPRESSOR) 50 MG tablet Take 50 mg by mouth 2 (two) times daily.   Yes [provider]  nitroGLYCERIN (NITROSTAT) 0.3 MG SL tablet Place 1 tablet (0.3 mg total) under the tongue every 5  (five) minutes as needed for chest pain. 04/05/15  Yes Henreitta Leber, MD  pomalidomide (POMALYST) 3 MG capsule Take 1 capsule by mouth daily for days 1-21, then 7 days rest 10/14/17  Yes [provider]  tamsulosin (FLOMAX) 0.4 MG CAPS capsule TAKE 1 CAPSULE EVERY DAY 12/17/16  Yes [provider]  valACYclovir (VALTREX) 500 MG tablet Take 500 mg by mouth daily. 10/05/17  Yes [provider]  aspirin EC 81 MG tablet Take 81 mg by mouth daily.    [provider]  lisinopril (PRINIVIL,ZESTRIL) 20 MG tablet Take 20 mg by mouth daily.    [provider]      PHYSICAL EXAMINATION:   VITAL SIGNS: Blood pressure 128/83, pulse 70, temperature 97.9 F (36.6 C), temperature source Oral, resp. rate 17, height 6\' 6"  (1.981 m), weight 77.1 kg, SpO2 98 %.  GENERAL:  67 y.o.-year-old patient lying in the bed with no acute distress.  EYES: Pupils equal, round, reactive to light and accommodation. No scleral icterus. Extraocular muscles intact.  HEENT: Head atraumatic, normocephalic. Oropharynx and nasopharynx clear.  NECK:  Supple, no jugular venous distention. No thyroid enlargement, no tenderness.  LUNGS: Normal breath sounds bilaterally, no wheezing, rales,rhonchi or crepitation. No use of accessory muscles of respiration.  CARDIOVASCULAR: S1, S2 normal. No S3/S4.  ABDOMEN: Soft, nontender, nondistended. Bowel sounds present. No organomegaly or mass.  EXTREMITIES: No pedal edema, cyanosis, or clubbing.  NEUROLOGIC: Cranial nerves II through XII are intact. Muscle strength 5/5 in all extremities. Sensation intact.   PSYCHIATRIC: The patient is alert and oriented x 3.  SKIN: No obvious rash, lesion, or ulcer.   LABORATORY PANEL:   CBC Recent Labs  Lab 10/20/17 2249  WBC 6.8  HGB 11.0*  HCT 33.2*  PLT 325  MCV 93.8  MCH 31.1  MCHC 33.1  RDW 14.0  LYMPHSABS 2.1  MONOABS 0.7  EOSABS 0.2  BASOSABS 0.0    ------------------------------------------------------------------------------------------------------------------  Chemistries  Recent Labs  Lab 10/20/17 2249  NA 138  K 3.3*  CL 103  CO2 22  GLUCOSE 313*  BUN 30*  CREATININE 1.22  CALCIUM 9.5  AST 110*  ALT 88*  ALKPHOS 93  BILITOT 0.9   ------------------------------------------------------------------------------------------------------------------ estimated creatinine clearance is 63.2 mL/min (by C-G formula based on SCr of 1.22 mg/dL). ------------------------------------------------------------------------------------------------------------------ No results for input(s): TSH, T4TOTAL, T3FREE, THYROIDAB in the last 72 hours.  Invalid input(s): FREET3   Coagulation profile No results for input(s): INR, PROTIME in the last 168 hours. ------------------------------------------------------------------------------------------------------------------- No results for input(s): DDIMER in the last 72 hours. -------------------------------------------------------------------------------------------------------------------  Cardiac Enzymes Recent Labs  Lab 10/20/17 2249  TROPONINI 0.04*   ------------------------------------------------------------------------------------------------------------------ Invalid input(s): POCBNP  ---------------------------------------------------------------------------------------------------------------  Urinalysis No results found for: COLORURINE, APPEARANCEUR, LABSPEC, PHURINE, GLUCOSEU, HGBUR, BILIRUBINUR, KETONESUR, PROTEINUR, UROBILINOGEN, NITRITE, LEUKOCYTESUR   RADIOLOGY: Dg Chest Port 1 View  Result Date: 10/20/2017 CLINICAL DATA:  Ventricular tachycardia. EXAM: PORTABLE CHEST 1 VIEW COMPARISON:  04/04/2015 FINDINGS: Cardiomegaly with tortuous nonaneurysmal thoracic aorta.  Mild interstitial edema with bibasilar atelectasis. No effusion or pneumothorax. Remote left posterior  fifth rib fracture, stable in appearance. IMPRESSION: Cardiomegaly with mild interstitial edema since prior. Electronically Signed   By: Ashley Royalty M.D.   On: 10/20/2017 23:37    EKG: Orders placed or performed during the hospital encounter of 10/20/17  . EKG 12-Lead  . EKG 12-Lead  . EKG 12-Lead  . EKG 12-Lead  . EKG 12-Lead  . EKG 12-Lead    IMPRESSION AND PLAN:  1.  Sustained wide-complex ventricular tachycardia, resolved status post cardioversion.  Continue to monitor patient clinically closely, continue telemetry monitor and replace electrolytes.  Will check 2D echo for further evaluation of cardiac function. Cardiology is consulted for further input. 2.  CHF, with ejection fraction around 30%.  Currently, clinically compensated.  Continue medical treatment. 3.  Diabetes type 2.  Will monitor blood sugars before meals and at bedtime and use insulin treatment during the hospital stay. 4.  Hypertension, stable, resume home medications. 5.  Hyperlipidemia, on statin.  All the records are reviewed and case discussed with ED provider. Management plans discussed with the patient, family and they are in agreement.  CODE STATUS: Full Code Status History    Date Active Date Inactive Code Status Order ID Comments User Context   04/05/2015 0420 04/05/2015 2041 Full Code [id number]  Lance Coon, MD Inpatient       TOTAL TIME TAKING CARE OF THIS PATIENT: 50 minutes.    Amelia Jo M.D on 10/21/2017 at 2:25 AM  Between 7am to 6pm - Pager - [phone]  After 6pm go to [email or web address] - password [email or web address] [facility] [place] [facility]  [phone]  CC: Primary care physician; System, Pcp Not In

## 2017-10-21 NOTE — Consult Note (Signed)
ANTICOAGULATION CONSULT NOTE - Initial Consult  Pharmacy Consult for Heparin infusion Indication: chest pain/ACS  No Known Allergies  Patient Measurements: Height: '6\' 6"'  (198.1 cm) Weight: 169 lb 14.4 oz (77.1 kg) IBW/kg (Calculated) : 91.4 Heparin Dosing Weight: 77.1  Vital Signs: Temp: 97.9 F (36.6 C) (10/14 0754) Temp Source: Oral (10/14 0754) BP: 135/93 (10/14 0754) Pulse Rate: 83 (10/14 0754)  Labs: Recent Labs    10/20/17 2249 10/21/17 0546 10/21/17 0917  HGB 11.0* 9.3*  --   HCT 33.2* 28.5*  --   PLT 325 253  --   CREATININE 1.22 1.02  --   TROPONINI 0.04*  --  7.43*    Estimated Creatinine Clearance: 75.6 mL/min (by C-G formula based on SCr of 1.02 mg/dL).   Medical History: Past Medical History:  Diagnosis Date  . CAD (coronary artery disease)   . Diabetes mellitus without complication ([place])   . HLD (hyperlipidemia)   . Hypertension   . MI (myocardial infarction) ([place])     Medications:  Medications Prior to Admission  Medication Sig Dispense Refill Last Dose  . apixaban (ELIQUIS) 5 MG TABS tablet TAKE 1 TABLET TWICE DAILY   10/20/2017 at 1700  . dexamethasone (DECADRON) 4 MG tablet TAKE 3 TABLETS BY MOUTH EVERY FOURTEEN (14) DAYS. TAKE ON TUESDAYS ON WEEKS WHEN NOT COMING FOR DARATUMUMAB INFUSION  3 prn at prn  . finasteride (PROSCAR) 5 MG tablet TAKE 1 TABLET EVERY DAY   10/20/2017 at Unknown time  . glipiZIDE (GLUCOTROL) 10 MG tablet Take 10 mg by mouth 2 (two) times daily before a meal.   10/20/2017 at 1700  . lovastatin (MEVACOR) 20 MG tablet Take 40 mg by mouth at bedtime.   10/20/2017 at 1700  . magnesium oxide (MAG-OX) 400 MG tablet Take 1 tablet by mouth daily.  6 10/20/2017 at 0730  . metFORMIN (GLUCOPHAGE) 1000 MG tablet Take 1,000 mg by mouth 2 (two) times daily with a meal.   10/20/2017 at 1700  . metoprolol (LOPRESSOR) 50 MG tablet Take 50 mg by mouth 2 (two) times daily.   10/20/2017 at 1700  . nitroGLYCERIN (NITROSTAT) 0.3 MG SL tablet  Place 1 tablet (0.3 mg total) under the tongue every 5 (five) minutes as needed for chest pain. 90 tablet 2 prn at prn  . pomalidomide (POMALYST) 3 MG capsule Take 1 capsule by mouth daily for days 1-21, then 7 days rest   10/20/2017 at 1700  . tamsulosin (FLOMAX) 0.4 MG CAPS capsule TAKE 1 CAPSULE EVERY DAY   10/20/2017 at 0730  . valACYclovir (VALTREX) 500 MG tablet Take 500 mg by mouth daily.  11 10/20/2017 at 0730  . aspirin EC 81 MG tablet Take 81 mg by mouth daily.   Not Taking at Unknown time  . lisinopril (PRINIVIL,ZESTRIL) 20 MG tablet Take 20 mg by mouth daily.   Not Taking at Unknown time    Assessment: 67 year old male referred for evaluation of ventricular tachycardia.  The patient has a known history of ischemic cardiomyopathy with LVEF 25 to 30%, coronary artery disease, status post NSTEMI with occluded LAD in 2001, chronically occluded LAD with collaterals by cardiac catheterization in 4132, chronic systolic congestive heart failure, essential hypertension, hyperlipidemia, type 2 diabetes, history of DVT on Eliquis, and multiple myeloma, currently undergoing once monthly chemotherapy. 2nd troponin elevated to 7.43.  Goal of Therapy:  Heparin level 0.3-0.7 units/ml aPTT 66-102 seconds Monitor platelets by anticoagulation protocol: Yes   Plan:  Give 4000 units bolus  x 1 Start heparin infusion at 900 units/hr Check aPTT every 6 hours until aPTT and heparin level correlate with therapeutic level and heparin level daily while on heparin. Continue to monitor H&H and platelets  Forrest Moron, PharmD 10/21/2017,11:36 AM

## 2017-10-22 LAB — APTT
APTT: 51 s — AB (ref 24–36)
aPTT: 39 seconds — ABNORMAL HIGH (ref 24–36)
aPTT: 46 seconds — ABNORMAL HIGH (ref 24–36)

## 2017-10-22 LAB — GLUCOSE, CAPILLARY
Glucose-Capillary: 160 mg/dL — ABNORMAL HIGH (ref 70–99)
Glucose-Capillary: 232 mg/dL — ABNORMAL HIGH (ref 70–99)
Glucose-Capillary: 161 mg/dL — ABNORMAL HIGH (ref 70–99)
Glucose-Capillary: 143 mg/dL — ABNORMAL HIGH (ref 70–99)

## 2017-10-22 LAB — CBC
HEMATOCRIT: 29.5 % — AB (ref 39.0–52.0)
HEMOGLOBIN: 9.6 g/dL — AB (ref 13.0–17.0)
MCH: 30.8 pg (ref 26.0–34.0)
MCHC: 32.5 g/dL (ref 30.0–36.0)
MCV: 94.6 fL (ref 80.0–100.0)
NRBC: 0 % (ref 0.0–0.2)
Platelets: 228 10*3/uL (ref 150–400)
RBC: 3.12 MIL/uL — AB (ref 4.22–5.81)
RDW: 14.4 % (ref 11.5–15.5)
WBC: 4.9 10*3/uL (ref 4.0–10.5)

## 2017-10-22 LAB — ECHOCARDIOGRAM COMPLETE
Height: 78 in
WEIGHTICAEL: 2718.4 [oz_av]

## 2017-10-22 LAB — BASIC METABOLIC PANEL
ANION GAP: 8 (ref 5–15)
BUN: 20 mg/dL (ref 8–23)
CALCIUM: 8.3 mg/dL — AB (ref 8.9–10.3)
CHLORIDE: 103 mmol/L (ref 98–111)
CO2: 27 mmol/L (ref 22–32)
Creatinine, Ser: 1.05 mg/dL (ref 0.61–1.24)
GFR calc Af Amer: >60 mL/min (ref >60)
GFR calc non Af Amer: >60 mL/min (ref >60)
GLUCOSE: 151 mg/dL — AB (ref 70–99)
POTASSIUM: 3.2 mmol/L — AB (ref 3.5–5.1)
Sodium: 138 mmol/L (ref 135–145)

## 2017-10-22 LAB — HIV ANTIBODY (ROUTINE TESTING W REFLEX): HIV SCREEN 4TH GENERATION: NONREACTIVE

## 2017-10-22 LAB — HEPARIN LEVEL (UNFRACTIONATED): Heparin Unfractionated: 0.58 IU/mL (ref 0.30–0.70)

## 2017-10-22 MED ORDER — SODIUM CHLORIDE 0.9 % IV SOLN
INTRAVENOUS | Status: DC
  Administered 2017-10-23: 06:00:00 via INTRAVENOUS

## 2017-10-22 MED ORDER — GLIPIZIDE 10 MG PO TABS
10.0000 mg | ORAL_TABLET | Freq: Two times a day (BID) | ORAL | Status: DC
  Administered 2017-10-22 – 2017-10-23 (×2): 10 mg via ORAL
  Filled 2017-10-22 (×3): qty 1

## 2017-10-22 MED ORDER — HEPARIN BOLUS VIA INFUSION
2300.0000 [IU] | Freq: Once | INTRAVENOUS | Status: AC
  Administered 2017-10-22: 2300 [IU] via INTRAVENOUS
  Filled 2017-10-22: qty 2300

## 2017-10-22 MED ORDER — HEPARIN BOLUS VIA INFUSION
3000.0000 [IU] | Freq: Once | INTRAVENOUS | Status: AC
  Administered 2017-10-22: 3000 [IU] via INTRAVENOUS
  Filled 2017-10-22: qty 3000

## 2017-10-22 MED ORDER — SODIUM CHLORIDE 0.9 % IV SOLN: 250.0000 mL | INTRAVENOUS | Status: DC | PRN

## 2017-10-22 MED ORDER — SODIUM CHLORIDE 0.9% FLUSH
3.0000 mL | Freq: Two times a day (BID) | INTRAVENOUS | Status: DC
  Administered 2017-10-22: 3 mL via INTRAVENOUS

## 2017-10-22 MED ORDER — SODIUM CHLORIDE 0.9% FLUSH: 3.0000 mL | INTRAVENOUS | Status: DC | PRN

## 2017-10-22 MED ORDER — HEPARIN BOLUS VIA INFUSION
1300.0000 [IU] | Freq: Once | INTRAVENOUS | Status: AC
  Administered 2017-10-22: 1300 [IU] via INTRAVENOUS
  Filled 2017-10-22: qty 1300

## 2017-10-22 MED ORDER — POTASSIUM CHLORIDE CRYS ER 20 MEQ PO TBCR
40.0000 meq | EXTENDED_RELEASE_TABLET | ORAL | Status: AC
  Administered 2017-10-22 (×2): 40 meq via ORAL
  Filled 2017-10-22 (×2): qty 2

## 2017-10-22 MED ORDER — ASPIRIN 81 MG PO CHEW
81.0000 mg | CHEWABLE_TABLET | ORAL | Status: AC
  Administered 2017-10-23: 81 mg via ORAL
  Filled 2017-10-22: qty 1

## 2017-10-22 NOTE — Progress Notes (Addendum)
[place] [facility] [place] NAME: Ronnie Andersen    MR#:  [id number]  DATE OF BIRTH:  [birth year]  SUBJECTIVE: Patient admitted for V. tach, hypotension, status post cardioversion in the emergency room, admitted to telemetry, he feels better now, found to have elevated troponins, started on heparin drip, amiodarone, patient scheduled for cardiac cath tomorrow.  Today morning he denies any chest pain or shortness of breath and slept well last night.  CHIEF COMPLAINT:   Chief Complaint  Patient presents with  . Chest Pain    REVIEW OF SYSTEMS:   ROS CONSTITUTIONAL: No fever, fatigue or weakness.  EYES: No blurred or double vision.  EARS, NOSE, AND THROAT: No tinnitus or ear pain.  RESPIRATORY: No cough, shortness of breath, wheezing or hemoptysis.  CARDIOVASCULAR: No chest pain, orthopnea, edema.  GASTROINTESTINAL: No nausea, vomiting, diarrhea or abdominal pain.  GENITOURINARY: No dysuria, hematuria.  ENDOCRINE: No polyuria, nocturia,  HEMATOLOGY: No anemia, easy bruising or bleeding SKIN: No rash or lesion. MUSCULOSKELETAL: No joint pain or arthritis.   NEUROLOGIC: No tingling, numbness, weakness.  PSYCHIATRY: No anxiety or depression.   DRUG ALLERGIES:  No Known Allergies  VITALS:  Blood pressure 122/79, pulse 80, temperature 98.1 F (36.7 C), temperature source Oral, resp. rate 16, height _0  (1.981 m), weight 86.7 kg, SpO2 96 %.  PHYSICAL EXAMINATION:  GENERAL:  67 y.o.-year-old patient lying in the bed with no acute distress.  EYES: Pupils equal, round, reactive to light and accommodation. No scleral icterus. Extraocular muscles intact.  HEENT: Head atraumatic, normocephalic. Oropharynx and nasopharynx clear.  NECK:  Supple, no jugular venous distention. No thyroid enlargement, no tenderness.  LUNGS: Normal breath sounds bilaterally, no wheezing, rales,rhonchi or crepitation. No use of accessory muscles of respiration.   CARDIOVASCULAR: S1, S2 normal. No murmurs, rubs, or gallops.  ABDOMEN: Soft, nontender, nondistended. Bowel sounds present. No organomegaly or mass.  EXTREMITIES: No pedal edema, cyanosis, or clubbing.  NEUROLOGIC: Cranial nerves II through XII are intact. Muscle strength 5/5 in all extremities. Sensation intact. Gait not checked.  PSYCHIATRIC: The patient is alert and oriented x 3.  SKIN: No obvious rash, lesion, or ulcer.    LABORATORY PANEL:   CBC Recent Labs  Lab 10/22/17 0532  WBC 4.9  HGB 9.6*  HCT 29.5*  PLT 228   ------------------------------------------------------------------------------------------------------------------  Chemistries  Recent Labs  Lab 10/20/17 2249 10/21/17 0546 10/22/17 0532  NA 138 140 138  K 3.3* 2.8* 3.2*  CL 103 103 103  CO2 _1 GLUCOSE 313* 94 151*  BUN 30* 26* 20  CREATININE 1.22 1.02 1.05  CALCIUM 9.5 8.7* 8.3*  MG  --  1.9  --   AST 110*  --   --   ALT 88*  --   --   ALKPHOS 93  --   --   BILITOT 0.9  --   --    ------------------------------------------------------------------------------------------------------------------  Cardiac Enzymes Recent Labs  Lab 10/21/17 1853  TROPONINI 3.99*   ------------------------------------------------------------------------------------------------------------------  RADIOLOGY:  Dg Chest Port 1 View  Result Date: 10/20/2017 CLINICAL DATA:  Ventricular tachycardia. EXAM: PORTABLE CHEST 1 VIEW COMPARISON:  04/04/2015 FINDINGS: Cardiomegaly with tortuous nonaneurysmal thoracic aorta. Mild interstitial edema with bibasilar atelectasis. No effusion or pneumothorax. Remote left posterior fifth rib fracture, stable in appearance. IMPRESSION: Cardiomegaly with mild interstitial edema since prior. Electronically Signed   By: Ashley Royalty M.D.   On: 10/20/2017 23:37    EKG:  Orders placed or performed during the hospital encounter of 10/20/17  . EKG 12-Lead  . EKG 12-Lead  . EKG  12-Lead  . EKG 12-Lead  . EKG 12-Lead  . EKG 12-Lead    ASSESSMENT AND PLAN:  #1 unstable V. tach, resolved with cardioversion in the emergency room, remained stable since yesterday, patient is on p.o. amiodarone as per cardiology recommendation, continue to monitor on telemetry.  Possible transfer to [place] on Thursday for ICD evaluation 2.  Chronic systolic heart failure with EF 30%, patient is clinically Compensated at this time. 3.  Non-ST elevation MI with elevated troponins up to 2.99 likely due to cardioversion but because of history of CAD patient is scheduled for cardiac cath tomorrow, continue heparin drip, aspirin, beta-blockers, statins. 4.  Hypokalemia, hypomagnesemia, continue to replace and recheck. #5 .  History of multiple myeloma, followed by [facility] hematology, getting chemotherapy at [facility] 5.  History of distal right leg DVT, patient was on Eliquis before but now on heparin drip. 6.  History of ischemic cardia myopathy with EF 25 to 30%, followed by Dr. Saralyn Pilar. 7.  History of chronic gout with recent flareup had swelling and redness of the right first toe and was given prednisone and now he feels better still has some redness but great toe examination does not look like he has a gout flare is not hot or tender. #8 patient is on Valtrex for herpes prophylaxis. 9.  Diabetes mellitus type 2: Patient on metformin, glipizide at home, restart glipizide, hold metformin for possible cardiac cath tomorrow and the contrast.  More than 50% time spent in reviewing old charts, previous old medications, discussing the care with patient, counseling and coordination of care All the records are reviewed and case discussed with Care Management/Social Workerr. Management plans discussed with the patient, family and they are in agreement.  CODE STATUS: Full code  TOTAL TIME TAKING CARE OF THIS PATIENT: 40 minutes POSSIBLE D/C IN 2 to DAYS, DEPENDING ON CLINICAL CONDITION.   Epifanio Lesches  M.D on 10/22/2017 at 9:58 AM  Between 7am to 6pm - Pager - [phone]  After 6pm go to [email or web address] - password [email or web address] [facility]  Office  [phone]  CC: Primary care physician; System, Pcp Not In   Note: This dictation was prepared with Dragon dictation along with smaller phrase technology. Any transcriptional errors that result from this process are unintentional.

## 2017-10-22 NOTE — Progress Notes (Addendum)
Pharmacy Electrolyte Monitoring Consult:  Pharmacy consulted to assist in monitoring and replacing electrolytes in this 67 y.o. male admitted on 10/20/2017 with Chest Pain   Labs:  Sodium (mmol/L)  Date Value  10/22/2017 138   Potassium (mmol/L)  Date Value  10/22/2017 3.2 (L)   Magnesium (mg/dL)  Date Value  10/21/2017 1.9   Phosphorus (mg/dL)  Date Value  10/21/2017 3.2   Calcium (mg/dL)  Date Value  10/22/2017 8.3 (L)   Albumin (g/dL)  Date Value  10/20/2017 3.4 (L)    Assessment/Plan: K - 3.2  Provider ordered KCl 40 mEq every 4 hours x 2 doses.  Will recheck Potassium with morning labs.  Evelena Asa, PharmD Clinical Pharmacist 10/22/2017

## 2017-10-22 NOTE — Consult Note (Signed)
ANTICOAGULATION CONSULT NOTE - Follow up  Pharmacy Consult for Heparin infusion Indication: chest pain/ACS  No Known Allergies  Patient Measurements: Height: '6\' 6"'  (198.1 cm) Weight: 191 lb 1.6 oz (86.7 kg) IBW/kg (Calculated) : 91.4 Heparin Dosing Weight: 77.1--> 86.7 kg  Vital Signs: Temp: 98.3 F (36.8 C) (10/15 1938) Temp Source: Oral (10/15 1938) BP: 114/77 (10/15 1938) Pulse Rate: 65 (10/15 1938)  Labs: Recent Labs    10/20/17 2249 10/21/17 0546 10/21/17 0917  10/21/17 1215 10/21/17 1504 10/21/17 1853 10/22/17 0532 10/22/17 1202 10/22/17 1919  HGB 11.0* 9.3*  --   --   --   --   --  9.6*  --   --   HCT 33.2* 28.5*  --   --   --   --   --  29.5*  --   --   PLT 325 253  --   --   --   --   --  228  --   --   APTT  --   --   --    < > 27  --  33 39* 46* 51*  LABPROT  --   --   --   --  14.8  --   --   --   --   --   INR  --   --   --   --  1.17  --   --   --   --   --   HEPARINUNFRC  --   --   --   --  0.85*  --   --   --  0.58  --   CREATININE 1.22 1.02  --   --   --   --   --  1.05  --   --   TROPONINI 0.04*  --  7.43*  --   --  5.09* 3.99*  --   --   --    < > = values in this interval not displayed.    Estimated Creatinine Clearance: 82.6 mL/min (by C-G formula based on SCr of 1.05 mg/dL).   Assessment: 67 year old male referred for evaluation of ventricular tachycardia.  The patient has a known history of ischemic cardiomyopathy with LVEF 25 to 30%, coronary artery disease, status post NSTEMI with occluded LAD in 2001, chronically occluded LAD with collaterals by cardiac catheterization in 3888, chronic systolic congestive heart failure, essential hypertension, hyperlipidemia, type 2 diabetes, history of DVT on Eliquis, and multiple myeloma, currently undergoing once monthly chemotherapy. 2nd troponin elevated to 7.43.  Heparin Course Currently on Heparin 900 units/hr, 18:30 aPTT resulted at 33 seconds  10/14 Give 2300 units bolus x 1 increase heparin  infusion to 1100 units/hr Check aPTT every 6 hours until aPTT and heparin level correlate with therapeutic level and heparin level daily while on heparin. Continue to monitor H&H and platelets  10/15 AM aPTT 39. 3000 unit bolus and increase rate to 1200 units/hr. Recheck aPTT in 6 hours.  10/15 1200 aPTT 46. Will order 2300 unit bolus and increase rate to 1300 units/hr. Recheck aPTT in 6 hours and heparin level with morning labs.   Goal of Therapy:  Heparin level 0.3-0.7 units/ml aPTT 66-102 seconds Monitor platelets by anticoagulation protocol: Yes   Plan:  10/15 1919 aPTT 51 - will bolus heparin 1300 units IV x1 and increase heparin drip to 1450 units/hr. Recheck aPTT level in 6h. CBC in AM  Pharmacy will continue to follow.   Rayna Sexton, PharmD,  BCPS Clinical Pharmacist 10/22/2017 8:43 PM

## 2017-10-22 NOTE — Progress Notes (Signed)
[facility] Cardiology  SUBJECTIVE: Patient sitting in bed, denies chest pain or shortness of breath   Vitals:   10/21/17 2158 10/22/17 0500 10/22/17 0508 10/22/17 0724  BP: 104/71  129/74 122/79  Pulse: 72  74 80  Resp:   16   Temp:   97.8 F (36.6 C) 98.1 F (36.7 C)  TempSrc:   Oral Oral  SpO2: 93%  96% 96%  Weight:  86.7 kg    Height:         Intake/Output Summary (Last 24 hours) at 10/22/2017 0810 Last data filed at 10/22/2017 1093 Gross per 24 hour  Intake 61.83 ml  Output 1725 ml  Net -1663.17 ml      PHYSICAL EXAM  General: Well developed, well nourished, in no acute distress HEENT:  Normocephalic and atramatic Neck:  No JVD.  Lungs: Clear bilaterally to auscultation and percussion. Heart: HRRR . Normal S1 and S2 without gallops or murmurs.  Abdomen: Bowel sounds are positive, abdomen soft and non-tender  Msk:  Back normal, normal gait. Normal strength and tone for age. Extremities: No clubbing, cyanosis or edema.   Neuro: Alert and oriented X 3. Psych:  Good affect, responds appropriately   LABS: Basic Metabolic Panel: Recent Labs    10/21/17 0546 10/22/17 0532  NA 140 138  K 2.8* 3.2*  CL 103 103  CO2 29 27  GLUCOSE 94 151*  BUN 26* 20  CREATININE 1.02 1.05  CALCIUM 8.7* 8.3*  MG 1.9  --   PHOS 3.2  --    Liver Function Tests: Recent Labs    10/20/17 2249  AST 110*  ALT 88*  ALKPHOS 93  BILITOT 0.9  PROT 6.2*  ALBUMIN 3.4*   No results for input(s): LIPASE, AMYLASE in the last 72 hours. CBC: Recent Labs    10/20/17 2249 10/21/17 0546 10/22/17 0532  WBC 6.8 3.8* 4.9  NEUTROABS 3.8  --   --   HGB 11.0* 9.3* 9.6*  HCT 33.2* 28.5* 29.5*  MCV 93.8 93.1 94.6  PLT 325 253 228   Cardiac Enzymes: Recent Labs    10/21/17 0917 10/21/17 1504 10/21/17 1853  TROPONINI 7.43* 5.09* 3.99*   BNP: Invalid input(s): POCBNP D-Dimer: No results for input(s): DDIMER in the last 72 hours. Hemoglobin A1C: No results for input(s): HGBA1C in  the last 72 hours. Fasting Lipid Panel: No results for input(s): CHOL, HDL, LDLCALC, TRIG, CHOLHDL, LDLDIRECT in the last 72 hours. Thyroid Function Tests: No results for input(s): TSH, T4TOTAL, T3FREE, THYROIDAB in the last 72 hours.  Invalid input(s): FREET3 Anemia Panel: No results for input(s): VITAMINB12, FOLATE, FERRITIN, TIBC, IRON, RETICCTPCT in the last 72 hours.  Dg Chest Port 1 View  Result Date: 10/20/2017 CLINICAL DATA:  Ventricular tachycardia. EXAM: PORTABLE CHEST 1 VIEW COMPARISON:  04/04/2015 FINDINGS: Cardiomegaly with tortuous nonaneurysmal thoracic aorta. Mild interstitial edema with bibasilar atelectasis. No effusion or pneumothorax. Remote left posterior fifth rib fracture, stable in appearance. IMPRESSION: Cardiomegaly with mild interstitial edema since prior. Electronically Signed   By: Ashley Royalty M.D.   On: 10/20/2017 23:37     Echo ending  TELEMETRY: Normal sinus rhythm:  ASSESSMENT AND PLAN:  Active Problems:   V-tach ([place])    1.  Monomorphic ventricular tachycardia, status post cardioversion 2.  Known CAD, history of chronically occluded LAD, troponin VII 0.43, possibly secondary to electrical cardioversion for VT, currently without chest pain 3.  Known ischemic cardiomyopathy, LVEF 25 to 30%, candidate for ICD  Recommendations  1.  Agree with current therapy 2.  Continue heparin drip 3.  Hold Eliquis 4.  Continue amiodarone load 5.  Cardiac catheterization with selective coronary arteriography scheduled for 10/1514/2019.  The risks, benefits alternatives of cardiac catheterization and possible PCI were explained to the patient and informed consent was obtained. 6.  Likely transfer to [facility] after cardiac catheterization for ICD and further management of VT   Isaias Cowman, MD, PhD, [facility] 10/22/2017 8:10 AM

## 2017-10-22 NOTE — Plan of Care (Signed)
  Problem: Clinical Measurements: Goal: Cardiovascular complication will be avoided Outcome: Progressing   Problem: Activity: Goal: Risk for activity intolerance will decrease Outcome: Progressing   Problem: Safety: Goal: Ability to remain free from injury will improve Outcome: Progressing   

## 2017-10-22 NOTE — Consult Note (Signed)
ANTICOAGULATION CONSULT NOTE - Initial Consult  Pharmacy Consult for Heparin infusion Indication: chest pain/ACS  No Known Allergies  Patient Measurements: Height: '6\' 6"'  (198.1 cm) Weight: 191 lb 1.6 oz (86.7 kg) IBW/kg (Calculated) : 91.4 Heparin Dosing Weight: 77.1  Vital Signs: Temp: 97.8 F (36.6 C) (10/15 0508) Temp Source: Oral (10/15 0508) BP: 129/74 (10/15 0508) Pulse Rate: 74 (10/15 0508)  Labs: Recent Labs    10/20/17 2249 10/21/17 0546 10/21/17 0917 10/21/17 1215 10/21/17 1504 10/21/17 1853 10/22/17 0532  HGB 11.0* 9.3*  --   --   --   --  9.6*  HCT 33.2* 28.5*  --   --   --   --  29.5*  PLT 325 253  --   --   --   --  228  APTT  --   --   --  27  --  33 39*  LABPROT  --   --   --  14.8  --   --   --   INR  --   --   --  1.17  --   --   --   HEPARINUNFRC  --   --   --  0.85*  --   --   --   CREATININE 1.22 1.02  --   --   --   --  1.05  TROPONINI 0.04*  --  7.43*  --  5.09* 3.99*  --     Estimated Creatinine Clearance: 82.6 mL/min (by C-G formula based on SCr of 1.05 mg/dL).   Medical History: Past Medical History:  Diagnosis Date  . CAD (coronary artery disease)   . Diabetes mellitus without complication ([place])   . HLD (hyperlipidemia)   . Hypertension   . MI (myocardial infarction) ([place])     Medications:  Medications Prior to Admission  Medication Sig Dispense Refill Last Dose  . apixaban (ELIQUIS) 5 MG TABS tablet TAKE 1 TABLET TWICE DAILY   10/20/2017 at 1700  . dexamethasone (DECADRON) 4 MG tablet TAKE 3 TABLETS BY MOUTH EVERY FOURTEEN (14) DAYS. TAKE ON TUESDAYS ON WEEKS WHEN NOT COMING FOR DARATUMUMAB INFUSION  3 prn at prn  . finasteride (PROSCAR) 5 MG tablet TAKE 1 TABLET EVERY DAY   10/20/2017 at Unknown time  . glipiZIDE (GLUCOTROL) 10 MG tablet Take 10 mg by mouth 2 (two) times daily before a meal.   10/20/2017 at 1700  . lovastatin (MEVACOR) 20 MG tablet Take 40 mg by mouth at bedtime.   10/20/2017 at 1700  . magnesium oxide  (MAG-OX) 400 MG tablet Take 1 tablet by mouth daily.  6 10/20/2017 at 0730  . metFORMIN (GLUCOPHAGE) 1000 MG tablet Take 1,000 mg by mouth 2 (two) times daily with a meal.   10/20/2017 at 1700  . metoprolol (LOPRESSOR) 50 MG tablet Take 50 mg by mouth 2 (two) times daily.   10/20/2017 at 1700  . nitroGLYCERIN (NITROSTAT) 0.3 MG SL tablet Place 1 tablet (0.3 mg total) under the tongue every 5 (five) minutes as needed for chest pain. 90 tablet 2 prn at prn  . pomalidomide (POMALYST) 3 MG capsule Take 1 capsule by mouth daily for days 1-21, then 7 days rest   10/20/2017 at 1700  . tamsulosin (FLOMAX) 0.4 MG CAPS capsule TAKE 1 CAPSULE EVERY DAY   10/20/2017 at 0730  . valACYclovir (VALTREX) 500 MG tablet Take 500 mg by mouth daily.  11 10/20/2017 at 0730  . aspirin EC 81 MG tablet  Take 81 mg by mouth daily.   Not Taking at Unknown time  . lisinopril (PRINIVIL,ZESTRIL) 20 MG tablet Take 20 mg by mouth daily.   Not Taking at Unknown time    Assessment: 67 year old male referred for evaluation of ventricular tachycardia.  The patient has a known history of ischemic cardiomyopathy with LVEF 25 to 30%, coronary artery disease, status post NSTEMI with occluded LAD in 2001, chronically occluded LAD with collaterals by cardiac catheterization in 3552, chronic systolic congestive heart failure, essential hypertension, hyperlipidemia, type 2 diabetes, history of DVT on Eliquis, and multiple myeloma, currently undergoing once monthly chemotherapy. 2nd troponin elevated to 7.43.  Currently on Heparin 900 units/hr, 18:30 aPTT resulted at 33 seconds  Goal of Therapy:  Heparin level 0.3-0.7 units/ml aPTT 66-102 seconds Monitor platelets by anticoagulation protocol: Yes   Plan:  Give 2300 units bolus x 1 increase heparin infusion to 1100 units/hr Check aPTT every 6 hours until aPTT and heparin level correlate with therapeutic level and heparin level daily while on heparin. Continue to monitor H&H and  platelets  10/15 AM aPTT 39. 3000 unit bolus and increase rate to 1200 units/hr. Recheck aPTT in 6 hours.  Sim Boast, PharmD, BCPS  10/22/17 6:35 AM

## 2017-10-22 NOTE — Consult Note (Signed)
ANTICOAGULATION CONSULT NOTE - Follow up  Pharmacy Consult for Heparin infusion Indication: chest pain/ACS  No Known Allergies  Patient Measurements: Height: '6\' 6"'  (198.1 cm) Weight: 191 lb 1.6 oz (86.7 kg) IBW/kg (Calculated) : 91.4 Heparin Dosing Weight: 77.1  Vital Signs: Temp: 98.1 F (36.7 C) (10/15 0724) Temp Source: Oral (10/15 0724) BP: 122/79 (10/15 0724) Pulse Rate: 80 (10/15 0724)  Labs: Recent Labs    10/20/17 2249 10/21/17 0546 10/21/17 0917  10/21/17 1215 10/21/17 1504 10/21/17 1853 10/22/17 0532 10/22/17 1202  HGB 11.0* 9.3*  --   --   --   --   --  9.6*  --   HCT 33.2* 28.5*  --   --   --   --   --  29.5*  --   PLT 325 253  --   --   --   --   --  228  --   APTT  --   --   --    < > 27  --  33 39* 46*  LABPROT  --   --   --   --  14.8  --   --   --   --   INR  --   --   --   --  1.17  --   --   --   --   HEPARINUNFRC  --   --   --   --  0.85*  --   --   --  0.58  CREATININE 1.22 1.02  --   --   --   --   --  1.05  --   TROPONINI 0.04*  --  7.43*  --   --  5.09* 3.99*  --   --    < > = values in this interval not displayed.    Estimated Creatinine Clearance: 82.6 mL/min (by C-G formula based on SCr of 1.05 mg/dL).   Medical History: Past Medical History:  Diagnosis Date  . CAD (coronary artery disease)   . Diabetes mellitus without complication ([place])   . HLD (hyperlipidemia)   . Hypertension   . MI (myocardial infarction) ([place])     Medications:  Medications Prior to Admission  Medication Sig Dispense Refill Last Dose  . apixaban (ELIQUIS) 5 MG TABS tablet TAKE 1 TABLET TWICE DAILY   10/20/2017 at 1700  . dexamethasone (DECADRON) 4 MG tablet TAKE 3 TABLETS BY MOUTH EVERY FOURTEEN (14) DAYS. TAKE ON TUESDAYS ON WEEKS WHEN NOT COMING FOR DARATUMUMAB INFUSION  3 prn at prn  . finasteride (PROSCAR) 5 MG tablet TAKE 1 TABLET EVERY DAY   10/20/2017 at Unknown time  . glipiZIDE (GLUCOTROL) 10 MG tablet Take 10 mg by mouth 2 (two) times daily before  a meal.   10/20/2017 at 1700  . lovastatin (MEVACOR) 20 MG tablet Take 40 mg by mouth at bedtime.   10/20/2017 at 1700  . magnesium oxide (MAG-OX) 400 MG tablet Take 1 tablet by mouth daily.  6 10/20/2017 at 0730  . metFORMIN (GLUCOPHAGE) 1000 MG tablet Take 1,000 mg by mouth 2 (two) times daily with a meal.   10/20/2017 at 1700  . metoprolol (LOPRESSOR) 50 MG tablet Take 50 mg by mouth 2 (two) times daily.   10/20/2017 at 1700  . nitroGLYCERIN (NITROSTAT) 0.3 MG SL tablet Place 1 tablet (0.3 mg total) under the tongue every 5 (five) minutes as needed for chest pain. 90 tablet 2 prn at prn  . pomalidomide (  POMALYST) 3 MG capsule Take 1 capsule by mouth daily for days 1-21, then 7 days rest   10/20/2017 at 1700  . tamsulosin (FLOMAX) 0.4 MG CAPS capsule TAKE 1 CAPSULE EVERY DAY   10/20/2017 at 0730  . valACYclovir (VALTREX) 500 MG tablet Take 500 mg by mouth daily.  11 10/20/2017 at 0730  . aspirin EC 81 MG tablet Take 81 mg by mouth daily.   Not Taking at Unknown time  . lisinopril (PRINIVIL,ZESTRIL) 20 MG tablet Take 20 mg by mouth daily.   Not Taking at Unknown time    Assessment: 67 year old male referred for evaluation of ventricular tachycardia.  The patient has a known history of ischemic cardiomyopathy with LVEF 25 to 30%, coronary artery disease, status post NSTEMI with occluded LAD in 2001, chronically occluded LAD with collaterals by cardiac catheterization in 5848, chronic systolic congestive heart failure, essential hypertension, hyperlipidemia, type 2 diabetes, history of DVT on Eliquis, and multiple myeloma, currently undergoing once monthly chemotherapy. 2nd troponin elevated to 7.43.  Currently on Heparin 900 units/hr, 18:30 aPTT resulted at 33 seconds  Goal of Therapy:  Heparin level 0.3-0.7 units/ml aPTT 66-102 seconds Monitor platelets by anticoagulation protocol: Yes   Plan:  Give 2300 units bolus x 1 increase heparin infusion to 1100 units/hr Check aPTT every 6 hours  until aPTT and heparin level correlate with therapeutic level and heparin level daily while on heparin. Continue to monitor H&H and platelets  10/15 AM aPTT 39. 3000 unit bolus and increase rate to 1200 units/hr. Recheck aPTT in 6 hours.  10/15 1200 aPTT 46. Will order 2300 unit bolus and increase rate to 1300 units/hr. Recheck aPTT in 6 hours and heparin level with morning labs.  Evelena Asa, PharmD 10/22/17 1:08 PM

## 2017-10-23 ENCOUNTER — Encounter: Payer: Self-pay | Admitting: *Deleted

## 2017-10-23 ENCOUNTER — Encounter
Admission: EM | Disposition: A | Discharge status: Other Acute Inpatient Hospital | Payer: Self-pay | Source: Home / Self Care | Attending: Internal Medicine

## 2017-10-23 HISTORY — PX: LEFT HEART CATH AND CORONARY ANGIOGRAPHY: CATH118249

## 2017-10-23 LAB — GLUCOSE, CAPILLARY
Glucose-Capillary: 154 mg/dL — ABNORMAL HIGH (ref 70–99)
Glucose-Capillary: 168 mg/dL — ABNORMAL HIGH (ref 70–99)
Glucose-Capillary: 110 mg/dL — ABNORMAL HIGH (ref 70–99)

## 2017-10-23 LAB — CBC
HEMATOCRIT: 29.8 % — AB (ref 39.0–52.0)
HEMOGLOBIN: 9.9 g/dL — AB (ref 13.0–17.0)
MCH: 31.3 pg (ref 26.0–34.0)
MCHC: 33.2 g/dL (ref 30.0–36.0)
MCV: 94.3 fL (ref 80.0–100.0)
Platelets: 236 10*3/uL (ref 150–400)
RBC: 3.16 MIL/uL — AB (ref 4.22–5.81)
RDW: 14.5 % (ref 11.5–15.5)
WBC: 5.9 10*3/uL (ref 4.0–10.5)
nRBC: 0 % (ref 0.0–0.2)

## 2017-10-23 LAB — POTASSIUM: Potassium: 3.7 mmol/L (ref 3.5–5.1)

## 2017-10-23 LAB — HEPARIN LEVEL (UNFRACTIONATED): HEPARIN UNFRACTIONATED: 0.68 [IU]/mL (ref 0.30–0.70)

## 2017-10-23 LAB — APTT: APTT: 74 s — AB (ref 24–36)

## 2017-10-23 SURGERY — LEFT HEART CATH AND CORONARY ANGIOGRAPHY
Anesthesia: Moderate Sedation | Laterality: Left

## 2017-10-23 MED ORDER — IOPAMIDOL (ISOVUE-300) INJECTION 61%
INTRAVENOUS | Status: DC | PRN
  Administered 2017-10-23: 170 mL via INTRA_ARTERIAL

## 2017-10-23 MED ORDER — MIDAZOLAM HCL 2 MG/2ML IJ SOLN
INTRAMUSCULAR | Status: AC
  Filled 2017-10-23: qty 2

## 2017-10-23 MED ORDER — MIDAZOLAM HCL 2 MG/2ML IJ SOLN
INTRAMUSCULAR | Status: DC | PRN
  Administered 2017-10-23: 1 mg via INTRAVENOUS

## 2017-10-23 MED ORDER — FENTANYL CITRATE (PF) 100 MCG/2ML IJ SOLN
INTRAMUSCULAR | Status: DC | PRN
  Administered 2017-10-23: 25 ug via INTRAVENOUS

## 2017-10-23 MED ORDER — SODIUM CHLORIDE 0.9 % IV SOLN: 250.0000 mL | INTRAVENOUS | Status: DC | PRN

## 2017-10-23 MED ORDER — ACETAMINOPHEN 325 MG PO TABS: 650.0000 mg | ORAL_TABLET | ORAL | Status: DC | PRN

## 2017-10-23 MED ORDER — FENTANYL CITRATE (PF) 100 MCG/2ML IJ SOLN
INTRAMUSCULAR | Status: AC
  Filled 2017-10-23: qty 2

## 2017-10-23 MED ORDER — VERAPAMIL HCL 2.5 MG/ML IV SOLN
INTRAVENOUS | Status: AC
  Filled 2017-10-23: qty 2

## 2017-10-23 MED ORDER — ONDANSETRON HCL 4 MG/2ML IJ SOLN: 4.0000 mg | Freq: Four times a day (QID) | INTRAMUSCULAR | Status: DC | PRN

## 2017-10-23 MED ORDER — SODIUM CHLORIDE 0.9% FLUSH: 3.0000 mL | Freq: Two times a day (BID) | INTRAVENOUS | Status: DC

## 2017-10-23 MED ORDER — SODIUM CHLORIDE 0.9% FLUSH: 3.0000 mL | INTRAVENOUS | Status: DC | PRN

## 2017-10-23 MED ORDER — HEPARIN (PORCINE) IN NACL 1000-0.9 UT/500ML-% IV SOLN
INTRAVENOUS | Status: AC
  Filled 2017-10-23: qty 1000

## 2017-10-23 MED ORDER — HEPARIN SODIUM (PORCINE) 1000 UNIT/ML IJ SOLN
INTRAMUSCULAR | Status: AC
  Filled 2017-10-23: qty 1

## 2017-10-23 MED ORDER — HEPARIN SODIUM (PORCINE) 1000 UNIT/ML IJ SOLN
INTRAMUSCULAR | Status: DC | PRN
  Administered 2017-10-23: 4500 [IU] via INTRAVENOUS

## 2017-10-23 MED ORDER — SODIUM CHLORIDE 0.9 % WEIGHT BASED INFUSION: 1.0000 mL/kg/h | INTRAVENOUS | Status: DC

## 2017-10-23 SURGICAL SUPPLY — 16 items
CATH INFINITI 5 FR JL6.0 (CATHETERS) ×3 IMPLANT
CATH INFINITI 5F FL5 125 (CATHETERS) ×3 IMPLANT
CATH INFINITI 5F MPA2 125 (CATHETERS) ×3 IMPLANT
CATH INFINITI 5F PIG 125CM (CATHETERS) ×3 IMPLANT
CATH INFINITI 5FR JR4 125CM (CATHETERS) ×3 IMPLANT
CATH INFINITI JR4 5F (CATHETERS) ×3 IMPLANT
DEVICE CLOSURE MYNXGRIP 5F (Vascular Products) ×3 IMPLANT
DEVICE RAD COMP TR BAND LRG (VASCULAR PRODUCTS) ×3 IMPLANT
GLIDESHEATH SLEND SS 6F .021 (SHEATH) ×3 IMPLANT
KIT MANI 3VAL PERCEP (MISCELLANEOUS) ×3 IMPLANT
NEEDLE PERC 18GX7CM (NEEDLE) ×3 IMPLANT
PACK CARDIAC CATH (CUSTOM PROCEDURE TRAY) ×3 IMPLANT
SHEATH AVANTI 5FR X 11CM (SHEATH) ×3 IMPLANT
WIRE GUIDERIGHT .035X150 (WIRE) ×3 IMPLANT
WIRE HITORQ VERSACORE ST 145CM (WIRE) ×3 IMPLANT
WIRE ROSEN-J .035X260CM (WIRE) ×6 IMPLANT

## 2017-10-23 NOTE — Progress Notes (Signed)
Patient transferred to [facility] via [place] s/p cardiac catheterization. Family in room at bedside updated and given patient transfer information. Patient given AVS, along with one provided in envelope for [facility]. Pt has no complaints of pain VSS at time of departure.

## 2017-10-23 NOTE — Progress Notes (Signed)
Pharmacy Electrolyte Monitoring Consult:  Pharmacy consulted to assist in monitoring and replacing electrolytes in this 66 y.o. male admitted on 10/20/2017 with Chest Pain   Labs:  Sodium (mmol/L)  Date Value  10/22/2017 138   Potassium (mmol/L)  Date Value  10/23/2017 3.7   Magnesium (mg/dL)  Date Value  10/21/2017 1.9   Phosphorus (mg/dL)  Date Value  10/21/2017 3.2   Calcium (mg/dL)  Date Value  10/22/2017 8.3 (L)   Albumin (g/dL)  Date Value  10/20/2017 3.4 (L)    Assessment/Plan: K - 3.7  No need for additional replacement at this time.  Will recheck with AM labs  Evelena Asa, PharmD Clinical Pharmacist 10/23/2017

## 2017-10-23 NOTE — Plan of Care (Signed)
  Problem: Clinical Measurements: Goal: Ability to maintain clinical measurements within normal limits will improve Outcome: Progressing Goal: Cardiovascular complication will be avoided Outcome: Progressing   

## 2017-10-23 NOTE — Consult Note (Signed)
ANTICOAGULATION CONSULT NOTE - Follow up  Pharmacy Consult for Heparin infusion Indication: chest pain/ACS  No Known Allergies  Patient Measurements: Height: _0  (198.1 cm) Weight: 191 lb 1.6 oz (86.7 kg) IBW/kg (Calculated) : 91.4 Heparin Dosing Weight: 77.1--> 86.7 kg  Vital Signs: Temp: 98.1 F (36.7 C) (10/16 0605) Temp Source: Oral (10/16 0605) BP: 136/85 (10/16 0605) Pulse Rate: 63 (10/16 0605)  Labs: Recent Labs    10/20/17 2249 10/21/17 0546 10/21/17 0917  10/21/17 1215 10/21/17 1504 10/21/17 1853 10/22/17 0532 10/22/17 1202 10/22/17 1919 10/23/17 0430  HGB 11.0* 9.3*  --   --   --   --   --  9.6*  --   --  9.9*  HCT 33.2* 28.5*  --   --   --   --   --  29.5*  --   --  29.8*  PLT 325 253  --   --   --   --   --  228  --   --  236  APTT  --   --   --    < > 27  --  33 39* 46* 51* 74*  LABPROT  --   --   --   --  14.8  --   --   --   --   --   --   INR  --   --   --   --  1.17  --   --   --   --   --   --   HEPARINUNFRC  --   --   --   --  0.85*  --   --   --  0.58  --  0.68  CREATININE 1.22 1.02  --   --   --   --   --  1.05  --   --   --   TROPONINI 0.04*  --  7.43*  --   --  5.09* 3.99*  --   --   --   --    < > = values in this interval not displayed.    Estimated Creatinine Clearance: 82.6 mL/min (by C-G formula based on SCr of 1.05 mg/dL).   Assessment: 67 year old male referred for evaluation of ventricular tachycardia.  The patient has a known history of ischemic cardiomyopathy with LVEF 25 to 30%, coronary artery disease, status post NSTEMI with occluded LAD in 2001, chronically occluded LAD with collaterals by cardiac catheterization in 2440, chronic systolic congestive heart failure, essential hypertension, hyperlipidemia, type 2 diabetes, history of DVT on Eliquis, and multiple myeloma, currently undergoing once monthly chemotherapy. 2nd troponin elevated to 7.43.  Heparin Course Currently on Heparin 900 units/hr, 18:30 aPTT resulted at 33  seconds  10/14 Give 2300 units bolus x 1 increase heparin infusion to 1100 units/hr Check aPTT every 6 hours until aPTT and heparin level correlate with therapeutic level and heparin level daily while on heparin. Continue to monitor H&H and platelets  10/15 AM aPTT 39. 3000 unit bolus and increase rate to 1200 units/hr. Recheck aPTT in 6 hours.  10/15 1200 aPTT 46. Will order 2300 unit bolus and increase rate to 1300 units/hr. Recheck aPTT in 6 hours and heparin level with morning labs.   Goal of Therapy:  Heparin level 0.3-0.7 units/ml aPTT 66-102 seconds Monitor platelets by anticoagulation protocol: Yes   Plan:  10/15 1919 aPTT 51 - will bolus heparin 1300 units IV x1 and increase heparin drip to 1450 units/hr.  Recheck aPTT level in 6h. CBC in AM  10/16 AM aPTT 74, heparin level 0.68. Now correlating in therapeutic range. Continue current regimen. Recheck heparin level and CBC with tomorrow AM labs.  Pharmacy will continue to follow.    Sim Boast, PharmD, BCPS  10/23/17 6:33 AM

## 2017-10-23 NOTE — Discharge Summary (Signed)
Ronnie Andersen, is a 67 y.o. male  DOB 1950-07-31  MRN [id number].  Admission date:  10/20/2017  Admitting Physician  Amelia Jo, MD  Discharge Date:  10/23/2017   Primary MD  System, Pcp Not In  Recommendations for primary care physician for things to follow:   Transfer to [facility] when the bed is available.   Admission Diagnosis  Ventricular tachycardia ([place]) [I47.2]   Discharge Diagnosis  Ventricular tachycardia ([place]) [I47.2]    Active Problems:   V-tach [facility])      Past Medical History:  Diagnosis Date  . CAD (coronary artery disease)   . Diabetes mellitus without complication ([place])   . HLD (hyperlipidemia)   . Hypertension   . MI (myocardial infarction) [facility])     Past Surgical History:  Procedure Laterality Date  . APPENDECTOMY    . BACK SURGERY    . CHOLECYSTECTOMY         History of present illness and  Hospital Course:     Kindly see H&P for history of present illness and admission details, please review complete Labs, Consult reports and Test reports for all details in brief  HPI  from the history and physical done on the day of admission 67 year old male patient with history of ischemic cardiomyopathy with EF 15-20 admitted for chest pain found to have unstable V. tach with heart rate greater than 200.  Patient received at nursing in the emergency room and underwent successful cardioversion with 200 J.  Admitted to telemetry.   Hospital Course  #1 V. tach status post successful cardioversion, now in sinus rhythm, has history of ischemic cardiopathy and EF 25 to 30%, patient found to have elevated troponins up to 7.43, patient seen by cardiology Dr. Saralyn Pilar.  For V. tach started on amiodarone 400 mg p.o. twice daily.  Did not have any further V. tach in the hospital. 2.  Non-ST elevation MI,  patient troponin peaked up to 7.43, patient started on heparin drip, discontinued Eliquis, had a cardiac cath today, it showed two-vessel CAD with chronically occluded mid LAD with hep C and contralateral collaterals, severe dilated cardiopathy with EF 25 to 30%.  Patient is seen by [facility] cardiology Dr. Mylinda Latina for ICD evaluation.  Will be transferred to [facility] today. #3 hypokalemia, hypomagnesemia, replaced, improved 4.  History of multiple myeloma, followed by [facility] hematology, getting chemotherapy at [facility] 4.  History of distal right leg DVT, he was on Eliquis before but started on heparin drip year because of elevated troponins and ALT for cardiac cath. 5.  Diabetes mellitus type 2, patient is on glipizide at home. 6.  History of chronic systolic heart failure.  Patient is on aspirin, beta-blockers, statins.   Discharge Condition: Stable   Follow UP      Discharge Instructions  and  Discharge Medications   Continue heparin drip, hold Eliquis until he gets defibrillator.  Discharge Instructions    AMB Referral to Cardiac Rehabilitation - Phase II   Complete by:  As directed    Diagnosis:  NSTEMI     Allergies as of 10/23/2017   No Known Allergies     Medication List    TAKE these medications   aspirin EC 81 MG tablet Take 81 mg by mouth daily.   dexamethasone 4 MG tablet Commonly known as:  DECADRON TAKE 3 TABLETS BY MOUTH EVERY FOURTEEN (14) DAYS. TAKE ON TUESDAYS ON WEEKS WHEN NOT COMING FOR DARATUMUMAB INFUSION   ELIQUIS 5 MG Tabs  tablet Generic drug:  apixaban TAKE 1 TABLET TWICE DAILY   finasteride 5 MG tablet Commonly known as:  PROSCAR TAKE 1 TABLET EVERY DAY   glipiZIDE 10 MG tablet Commonly known as:  GLUCOTROL Take 10 mg by mouth 2 (two) times daily before a meal.   lisinopril 20 MG tablet Commonly known as:  PRINIVIL,ZESTRIL Take 20 mg by mouth daily.   lovastatin 20 MG tablet Commonly known as:  MEVACOR Take 40 mg by mouth at bedtime.    magnesium oxide 400 MG tablet Commonly known as:  MAG-OX Take 1 tablet by mouth daily.   metFORMIN 1000 MG tablet Commonly known as:  GLUCOPHAGE Take 1,000 mg by mouth 2 (two) times daily with a meal.   metoprolol tartrate 50 MG tablet Commonly known as:  LOPRESSOR Take 50 mg by mouth 2 (two) times daily.   nitroGLYCERIN 0.3 MG SL tablet Commonly known as:  NITROSTAT Place 1 tablet (0.3 mg total) under the tongue every 5 (five) minutes as needed for chest pain.   pomalidomide 3 MG capsule Commonly known as:  POMALYST Take 1 capsule by mouth daily for days 1-21, then 7 days rest   tamsulosin 0.4 MG Caps capsule Commonly known as:  FLOMAX TAKE 1 CAPSULE EVERY DAY   valACYclovir 500 MG tablet Commonly known as:  VALTREX Take 500 mg by mouth daily.         Diet and Activity recommendation: See Discharge Instructions above   Consults obtained -cardiology   Major procedures and Radiology Reports - PLEASE review detailed and final reports for all details, in brief -     Dg Chest Port 1 View  Result Date: 10/20/2017 CLINICAL DATA:  Ventricular tachycardia. EXAM: PORTABLE CHEST 1 VIEW COMPARISON:  04/04/2015 FINDINGS: Cardiomegaly with tortuous nonaneurysmal thoracic aorta. Mild interstitial edema with bibasilar atelectasis. No effusion or pneumothorax. Remote left posterior fifth rib fracture, stable in appearance. IMPRESSION: Cardiomegaly with mild interstitial edema since prior. Electronically Signed   By: Ashley Royalty M.D.   On: 10/20/2017 23:37    Micro Results    No results found for this or any previous visit (from the past 240 hour(s)).     Today   Subjective:   Ronnie Andersen today stable for discharge to [facility] when the bed is available.  Objective:   Blood pressure 117/66, pulse (!) 46, temperature 97.7 F (36.5 C), temperature source Oral, resp. rate 18, height _0  (1.981 m), weight 85.7 kg, SpO2 96 %.   Intake/Output Summary  (Last 24 hours) at 10/23/2017 1249 Last data filed at 10/22/2017 1702 Gross per 24 hour  Intake -  Output 250 ml  Net -250 ml    Exam Awake Alert, Oriented x 3, No new F.N deficits, Normal affect [place].AT,PERRAL Supple Neck,No JVD, No cervical lymphadenopathy appriciated.  Symmetrical Chest wall movement, Good air movement bilaterally, CTAB RRR,No Gallops,Rubs or new Murmurs, No Parasternal Heave +ve B.Sounds, Abd Soft, Non tender, No organomegaly appriciated, No rebound -guarding or rigidity. No Cyanosis, Clubbing or edema, No new Rash or bruise  Data Review   CBC w Diff:  Lab Results  Component Value Date   WBC 5.9 10/23/2017   HGB 9.9 (L) 10/23/2017   HCT 29.8 (L) 10/23/2017   PLT 236 10/23/2017   LYMPHOPCT 30 10/20/2017   MONOPCT 10 10/20/2017   EOSPCT 3 10/20/2017   BASOPCT 0 10/20/2017    CMP:  Lab Results  Component Value Date   NA 138 10/22/2017  K 3.7 10/23/2017   CL 103 10/22/2017   CO2 27 10/22/2017   BUN 20 10/22/2017   CREATININE 1.05 10/22/2017   PROT 6.2 (L) 10/20/2017   ALBUMIN 3.4 (L) 10/20/2017   BILITOT 0.9 10/20/2017   ALKPHOS 93 10/20/2017   AST 110 (H) 10/20/2017   ALT 88 (H) 10/20/2017  .   Total Time in preparing paper work, data evaluation and todays exam - 35 minutes  Epifanio Lesches M.D on 10/23/2017 at 12:49 PM    Note: This dictation was prepared with Dragon dictation along with smaller phrase technology. Any transcriptional errors that result from this process are unintentional.

## 2017-10-23 NOTE — Plan of Care (Signed)
  Problem: Education: Goal: Knowledge of General Education information will improve Description: Including pain rating scale, medication(s)/side effects and non-pharmacologic comfort measures Outcome: Progressing   Problem: Activity: Goal: Ability to tolerate increased activity will improve Outcome: Progressing   

## 2017-10-24 ENCOUNTER — Encounter: Payer: Self-pay | Admitting: Cardiology

## 2017-10-26 MED ORDER — GENERIC EXTERNAL MEDICATION
1.00 | Status: DC
Start: ? — End: 2017-10-26

## 2017-10-26 MED ORDER — TAMSULOSIN HCL 0.4 MG PO CAPS
0.40 | ORAL_CAPSULE | ORAL | Status: DC
Start: 2017-10-27 — End: 2017-10-26

## 2017-10-26 MED ORDER — DEXTROSE 50 % IV SOLN
12.50 | INTRAVENOUS | Status: DC
Start: ? — End: 2017-10-26

## 2017-10-26 MED ORDER — INSULIN LISPRO 100 UNIT/ML ~~LOC~~ SOLN
.00 | SUBCUTANEOUS | Status: DC
Start: 2017-10-26 — End: 2017-10-26

## 2017-10-26 MED ORDER — AMIODARONE HCL 200 MG PO TABS
400.00 | ORAL_TABLET | ORAL | Status: DC
Start: 2017-10-26 — End: 2017-10-26

## 2017-10-26 MED ORDER — LOVASTATIN 20 MG PO TABS
40.00 | ORAL_TABLET | ORAL | Status: DC
Start: 2017-10-26 — End: 2017-10-26

## 2017-10-26 MED ORDER — ONDANSETRON HCL 4 MG/2ML IJ SOLN
4.00 | INTRAMUSCULAR | Status: DC
Start: ? — End: 2017-10-26

## 2017-10-26 MED ORDER — VALACYCLOVIR HCL 500 MG PO TABS
500.00 | ORAL_TABLET | ORAL | Status: DC
Start: 2017-10-27 — End: 2017-10-26

## 2017-10-26 MED ORDER — FINASTERIDE 5 MG PO TABS
5.00 | ORAL_TABLET | ORAL | Status: DC
Start: 2017-10-27 — End: 2017-10-26

## 2017-10-26 MED ORDER — METOPROLOL TARTRATE 50 MG PO TABS
50.00 | ORAL_TABLET | ORAL | Status: DC
Start: 2017-10-26 — End: 2017-10-26

## 2017-10-26 MED ORDER — FENTANYL CITRATE (PF) 50 MCG/ML IJ SOLN
25.00 | INTRAMUSCULAR | Status: DC
Start: ? — End: 2017-10-26

## 2017-10-26 MED ORDER — ASPIRIN EC 81 MG PO TBEC
81.00 | DELAYED_RELEASE_TABLET | ORAL | Status: DC
Start: 2017-10-27 — End: 2017-10-26

## 2017-10-26 MED ORDER — OXYCODONE-ACETAMINOPHEN 5-325 MG PO TABS
1.00 | ORAL_TABLET | ORAL | Status: DC
Start: ? — End: 2017-10-26

## 2017-10-26 MED ORDER — ACETAMINOPHEN 325 MG PO TABS
650.00 | ORAL_TABLET | ORAL | Status: DC
Start: ? — End: 2017-10-26

## 2018-03-22 ENCOUNTER — Emergency Department: Payer: Medicare HMO

## 2018-03-22 ENCOUNTER — Emergency Department
Admission: EM | Admit: 2018-03-22 | Discharge: 2018-03-23 | Disposition: A | Discharge status: Other Acute Inpatient Hospital | Payer: Medicare HMO | Attending: Emergency Medicine | Admitting: Emergency Medicine

## 2018-03-22 ENCOUNTER — Other Ambulatory Visit: Payer: Self-pay

## 2018-03-22 DIAGNOSIS — R0602 Shortness of breath: Secondary | ICD-10-CM | POA: Diagnosis present

## 2018-03-22 DIAGNOSIS — I2510 Atherosclerotic heart disease of native coronary artery without angina pectoris: Secondary | ICD-10-CM | POA: Insufficient documentation

## 2018-03-22 DIAGNOSIS — R4182 Altered mental status, unspecified: Secondary | ICD-10-CM | POA: Diagnosis not present

## 2018-03-22 DIAGNOSIS — E119 Type 2 diabetes mellitus without complications: Secondary | ICD-10-CM | POA: Diagnosis not present

## 2018-03-22 DIAGNOSIS — Z79899 Other long term (current) drug therapy: Secondary | ICD-10-CM | POA: Diagnosis not present

## 2018-03-22 DIAGNOSIS — I10 Essential (primary) hypertension: Secondary | ICD-10-CM | POA: Insufficient documentation

## 2018-03-22 DIAGNOSIS — A419 Sepsis, unspecified organism: Secondary | ICD-10-CM | POA: Diagnosis not present

## 2018-03-22 DIAGNOSIS — J189 Pneumonia, unspecified organism: Secondary | ICD-10-CM | POA: Diagnosis not present

## 2018-03-22 DIAGNOSIS — Z7982 Long term (current) use of aspirin: Secondary | ICD-10-CM | POA: Diagnosis not present

## 2018-03-22 DIAGNOSIS — J9601 Acute respiratory failure with hypoxia: Secondary | ICD-10-CM | POA: Insufficient documentation

## 2018-03-22 DIAGNOSIS — Z7984 Long term (current) use of oral hypoglycemic drugs: Secondary | ICD-10-CM | POA: Diagnosis not present

## 2018-03-22 DIAGNOSIS — I252 Old myocardial infarction: Secondary | ICD-10-CM | POA: Insufficient documentation

## 2018-03-22 DIAGNOSIS — Z7901 Long term (current) use of anticoagulants: Secondary | ICD-10-CM | POA: Insufficient documentation

## 2018-03-22 LAB — CBC WITH DIFFERENTIAL/PLATELET
Abs Immature Granulocytes: 0.08 10*3/uL — ABNORMAL HIGH (ref 0.00–0.07)
Basophils Absolute: 0 10*3/uL (ref 0.0–0.1)
Basophils Relative: 0 %
Eosinophils Absolute: 0.1 10*3/uL (ref 0.0–0.5)
Eosinophils Relative: 1 %
HCT: 28.4 % — ABNORMAL LOW (ref 39.0–52.0)
HEMOGLOBIN: 9.2 g/dL — AB (ref 13.0–17.0)
Immature Granulocytes: 1 %
LYMPHS PCT: 11 %
Lymphs Abs: 1.2 10*3/uL (ref 0.7–4.0)
MCH: 32.3 pg (ref 26.0–34.0)
MCHC: 32.4 g/dL (ref 30.0–36.0)
MCV: 99.6 fL (ref 80.0–100.0)
Monocytes Absolute: 0.8 10*3/uL (ref 0.1–1.0)
Monocytes Relative: 7 %
NEUTROS ABS: 8.6 10*3/uL — AB (ref 1.7–7.7)
Neutrophils Relative %: 80 %
Platelets: 127 10*3/uL — ABNORMAL LOW (ref 150–400)
RBC: 2.85 MIL/uL — ABNORMAL LOW (ref 4.22–5.81)
RDW: 16.3 % — ABNORMAL HIGH (ref 11.5–15.5)
WBC: 10.8 10*3/uL — ABNORMAL HIGH (ref 4.0–10.5)
nRBC: 0 % (ref 0.0–0.2)

## 2018-03-22 LAB — LACTIC ACID, PLASMA: Lactic Acid, Venous: 2.4 mmol/L (ref 0.5–1.9)

## 2018-03-22 LAB — COMPREHENSIVE METABOLIC PANEL
ALT: 22 U/L (ref 0–44)
AST: 20 U/L (ref 15–41)
Albumin: 3 g/dL — ABNORMAL LOW (ref 3.5–5.0)
Alkaline Phosphatase: 85 U/L (ref 38–126)
Anion gap: 8 (ref 5–15)
BUN: 31 mg/dL — ABNORMAL HIGH (ref 8–23)
CHLORIDE: 101 mmol/L (ref 98–111)
CO2: 22 mmol/L (ref 22–32)
Calcium: 8.8 mg/dL — ABNORMAL LOW (ref 8.9–10.3)
Creatinine, Ser: 2.05 mg/dL — ABNORMAL HIGH (ref 0.61–1.24)
GFR calc Af Amer: 37 mL/min — ABNORMAL LOW (ref >60)
GFR calc non Af Amer: 32 mL/min — ABNORMAL LOW (ref >60)
Glucose, Bld: 226 mg/dL — ABNORMAL HIGH (ref 70–99)
POTASSIUM: 5 mmol/L (ref 3.5–5.1)
SODIUM: 131 mmol/L — AB (ref 135–145)
Total Bilirubin: 1.2 mg/dL (ref 0.3–1.2)
Total Protein: 5.7 g/dL — ABNORMAL LOW (ref 6.5–8.1)

## 2018-03-22 LAB — TROPONIN I: Troponin I: 0.04 ng/mL (ref <0.03)

## 2018-03-22 LAB — PROTIME-INR
INR: 1.5 — AB (ref 0.8–1.2)
Prothrombin Time: 17.9 seconds — ABNORMAL HIGH (ref 11.4–15.2)

## 2018-03-22 LAB — INFLUENZA PANEL BY PCR (TYPE A & B)
Influenza A By PCR: NEGATIVE
Influenza B By PCR: NEGATIVE

## 2018-03-22 LAB — PROCALCITONIN: PROCALCITONIN: 3.85 ng/mL

## 2018-03-22 LAB — LIPASE, BLOOD: Lipase: 21 U/L (ref 11–51)

## 2018-03-22 MED ORDER — SODIUM CHLORIDE 0.9 % IV SOLN
2.0000 g | Freq: Once | INTRAVENOUS | Status: AC
  Administered 2018-03-23: 2 g via INTRAVENOUS
  Filled 2018-03-22: qty 2

## 2018-03-22 MED ORDER — SODIUM CHLORIDE 0.9 % IV SOLN: 2.0000 g | Freq: Two times a day (BID) | INTRAVENOUS | Status: DC

## 2018-03-22 MED ORDER — SODIUM CHLORIDE 0.9 % IV BOLUS
1000.0000 mL | Freq: Once | INTRAVENOUS | Status: AC
  Administered 2018-03-22: 1000 mL via INTRAVENOUS

## 2018-03-22 NOTE — ED Triage Notes (Signed)
Patient arrived by [facility] from [facility] nursing facility. Patient complaint is SOB onset today, with N/V/D with last episode happenings right before transport to ER. When EMS arrived patient was placed on 2liter O2 per facility. Per family patient has been congested with a productive cough. Per daughter patient has started complaining of neck pain and last time he was complaining of neck pain he was diagnosed with a fungal infection. Patient is a patient at [facility] and was recently released March 2nd, 2020.Marland Kitchen

## 2018-03-22 NOTE — ED Notes (Signed)
EDP made aware of critical Lactic value and troponin value.

## 2018-03-22 NOTE — Progress Notes (Signed)
Pharmacy Antibiotic Note  Ronnie Andersen is a 68 y.o. male admitted on 03/22/2018 with UTI.  Pharmacy has been consulted for cefepime dosing.  Plan: Will start cefepime 2g IV q12h per CrCl 30 - 60 ml/min  Height: 6\' 6"  (198.1 cm) Weight: 178 lb (80.7 kg) IBW/kg (Calculated) : 91.4  Temp (24hrs), Avg:99.7 F (37.6 C), Min:99.7 F (37.6 C), Max:99.7 F (37.6 C)  Recent Labs  Lab 03/22/18 2147 03/22/18 2158  WBC 10.8*  --   CREATININE 2.05*  --   LATICACIDVEN  --  2.4*    Estimated Creatinine Clearance: 38.8 mL/min (A) (by C-G formula based on SCr of 2.05 mg/dL (H)).    No Known Allergies  Thank you for allowing pharmacy to be a part of this patient's care.  Tobie Lords, PharmD, BCPS Clinical Pharmacist 03/22/2018

## 2018-03-23 ENCOUNTER — Emergency Department: Payer: Medicare HMO

## 2018-03-23 LAB — URINALYSIS, COMPLETE (UACMP) WITH MICROSCOPIC
Bacteria, UA: NONE SEEN
Bilirubin Urine: NEGATIVE
Glucose, UA: NEGATIVE mg/dL
Ketones, ur: NEGATIVE mg/dL
Leukocytes,Ua: NEGATIVE
Nitrite: NEGATIVE
Protein, ur: 30 mg/dL — AB
SPECIFIC GRAVITY, URINE: 1.018 (ref 1.005–1.030)
pH: 5 (ref 5.0–8.0)

## 2018-03-23 LAB — LACTIC ACID, PLASMA: LACTIC ACID, VENOUS: 1.5 mmol/L (ref 0.5–1.9)

## 2018-03-23 MED ORDER — ACETAMINOPHEN 325 MG PO TABS
650.0000 mg | ORAL_TABLET | Freq: Once | ORAL | Status: AC
  Administered 2018-03-23: 650 mg via ORAL
  Filled 2018-03-23: qty 2

## 2018-03-23 MED ORDER — VANCOMYCIN HCL 10 G IV SOLR
2000.0000 mg | Freq: Once | INTRAVENOUS | Status: DC
  Filled 2018-03-23: qty 2000

## 2018-03-23 MED ORDER — METRONIDAZOLE IN NACL 5-0.79 MG/ML-% IV SOLN
500.0000 mg | Freq: Once | INTRAVENOUS | Status: AC
  Administered 2018-03-23: 500 mg via INTRAVENOUS
  Filled 2018-03-23: qty 100

## 2018-03-23 MED ORDER — DOPAMINE-DEXTROSE 3.2-5 MG/ML-% IV SOLN: 0.0000 ug/kg/min | INTRAVENOUS | Status: DC

## 2018-03-23 MED ORDER — VANCOMYCIN HCL IN DEXTROSE 1-5 GM/200ML-% IV SOLN: 1000.0000 mg | Freq: Once | INTRAVENOUS | Status: DC

## 2018-03-23 MED ORDER — VANCOMYCIN HCL 10 G IV SOLR
1750.0000 mg | Freq: Once | INTRAVENOUS | Status: AC
  Administered 2018-03-23: 1750 mg via INTRAVENOUS
  Filled 2018-03-23: qty 1750

## 2018-03-23 NOTE — ED Notes (Signed)
EDP in with patient and family giving update.

## 2018-03-23 NOTE — ED Provider Notes (Addendum)
----------------------------------------- 1:30 AM on 03/23/2018 -----------------------------------------  I assumed ED care for this patient with Dr. Joni Fears left after the conclusion of his shift.  He had spoken with a hospitalist at [facility] but they both agree that the patient would be best served at [facility] given the complexity of the case and the fact the patient was recently treated as an inpatient at [facility].    I just got off the phone with Dr. Nelda Bucks with the [facility] medical ICU.  She has accepted the patient but they are going to work out exactly what service will accept the patient, so the [facility] transfer center will call me back with the specifics.  However they have agreed that the patient would be best served at [facility] and agrees with the care provided so far.  We discussed the possibility of COVID-19 but it seems very unlikely for this patient based on not only a lack of exposure but the fact that the patient has a procalcitonin elevation which is not typically seen, he has not lymphopenic, does not have the characteristic interstitial findings on chest CT, etc.  [facility] will proceed and discuss whether or not he should be tested by they agreed that he does not require testing at this time.  We are awaiting bed assignment and transportation.    ----------------------------------------- 1:50 AM on 03/23/2018 -----------------------------------------  Spoke with Dr. Ellsworth Lennox at [facility], another intensivist in the MICU.  She does not have a stepdown bed to admit the patient and wondered whether the patient would be appropriate for floor bed.  They have asked for me to speak with the oncologist to see if they will admit the patient directly.  I am now waiting for the oncologist to call back.  In the meantime I am going to try decreasing the patient's oxygen requirement to determine if in fact he does have an oxygen requirement or if he would be stable on room  air.    ----------------------------------------- 2:27 AM on 03/23/2018 -----------------------------------------  I spoke by phone with Dr. Martyn Ehrich, oncology fellow, who admitted the patient on behalf of Dr. Gloriann Loan who is the accepting attending.  They are going to call back with a bed assignment.  I will update the family.  The patient is currently stable on 2 L of oxygen given his variable oxygenation; after I turned off the oxygen, he had no episodes of apnea but his oxygenation was very between about 98% and 87% with a good waveform throughout.  I put him back on 2 L to be safe.  The [facility] staff is aware of his clinical status.     ----------------------------------------- 4:27 AM on 03/23/2018 -----------------------------------------  I was called back by Dr. Martyn Ehrich.  She informed me that after speaking with her charge nurse, she was told that the nursing staff was not comfortable caring for this patient because they are concerned he may have COVID-19 and thus is not appropriate for their service.  I pointed out I had already spoken with Dr. Ara Kussmaul at the [facility] ICU who is in charge of screening for possible COVID patients and Dr. Martyn Ehrich told me that the decision was final, they were resending the offer of the bed, and that since the nursing staff are the ones that will be primarily dealing with and caring for the patient she needs to listen to them.  She had not spoken with her attending, Dr. Gloriann Loan, but allowed for the possibility that he may change his mind later in  the day and may accept the patient, but she expressed doubt that that would be the case.  I pointed out again that the patient not only is a patient at [facility] and this would provide continuity of care for his extensive comorbid medical conditions but that he has charity care at [facility] and will incur a substantial bill if he is brought into this facility even for short period of time.  Most importantly he will benefit from a  higher level of care at [facility], but I was again informed that the decision was final.  The [facility] transfer center representative then suggested that I speak again with Dr. Ara Kussmaul in the ICU.  I spoke with her by phone after Dr. Martyn Ehrich disconnected and we discussed the case again in detail including the patient's relative stability and how he would benefit from coming to [facility].  Dr. Ara Kussmaul said that she would speak with a couple of other providers at [facility] and they would come up with a plan, but she agreed that he should be at [facility] for the best possible care and will try to find an appropriate bed assignment for him, whether that is in the stepdown unit which she oversees or a different medical team.  I was told that I would be contacted with a final decision about the accepting physician and bed assignment, or whether they resend the offer of the bed completely.  Dr. Ara Kussmaul, however, felt strongly that the patient should be transferred to [facility] and I agree.  I have updated the family multiple times and explained that we are trying to work at the best possible situation for the patient and the appropriate care team at [facility] and we will keep them updated.  The patient has been sleeping comfortably on 2 L of oxygen.   ----------------------------------------- 5:42 AM on 03/23/2018 -----------------------------------------  I received a call back from the [facility] patient logistics center. Dr. Jeronimo Greaves will be the accepting physician to the stepdown unit.  A patient is being moved out and the bed will be cleaned and then they will call back with a bed assignment.  I will update the family.  Patient remains hemodynamically stable.   ----------------------------------------- 7:29 AM on 03/23/2018 -----------------------------------------  The patient is still awaiting a bed assignment.  Plan has not changed. Discussed case with Dr. Kerman Passey who is taking over for me.  Do not anticipate any necessary intervention,  and hopefully bed will be assigned soon.   Hinda Kehr, MD 03/23/18 0730

## 2018-03-23 NOTE — ED Provider Notes (Signed)
[facility] Emergency Department Provider Note  ____________________________________________  Time seen: Approximately 12:06 AM  I have reviewed the triage vital signs and the nursing notes.   HISTORY  Chief Complaint Fever    HPI Ronnie Andersen is a 67 y.o. male with a past medical history of hypertension hyperlipidemia diabetes and coronary artery disease who presents with shortness of breath.  He also has a history of ischemic cardiomyopathy and an ejection fraction of 20 to 25%.  The patient also has a history of multiple myeloma, treated at [facility] with immunosuppressants.  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  He was admitted to [facility] on February 06, 2018, treated with amphotericin and improved.  He was discharged about 2 weeks ago to [facility] for rehab on a long course of fluconazole.  Earlier today, he started having gradually worsening shortness of breath along with nausea and diarrhea.  Also has a productive cough and seems to have difficulty clearing his throat.  Denies chest pain.  He has chronic neck pain which is stable compared to last few months, not worse.  Able to move his neck through range of motion.      Past Medical History:  Diagnosis Date  . CAD (coronary artery disease)   . Diabetes mellitus without complication ([place])   . HLD (hyperlipidemia)   . Hypertension   . MI (myocardial infarction) [facility])      Patient Active Problem List   Diagnosis Date Noted  . V-tach ([place]) 10/21/2017  . Angina pectoris ([place]) 04/05/2015  . CAD (coronary artery disease) 04/05/2015  . HTN (hypertension) 04/05/2015  . HLD (hyperlipidemia) 04/05/2015  . Type 2 diabetes mellitus ([place]) 04/05/2015     Past Surgical History:  Procedure Laterality Date  . APPENDECTOMY    . BACK SURGERY    . CHOLECYSTECTOMY    . LEFT HEART CATH AND CORONARY ANGIOGRAPHY Left 10/23/2017   Procedure: LEFT  HEART CATH AND CORONARY ANGIOGRAPHY;  Surgeon: Isaias Cowman, MD;  Location: [facility];  Service: Cardiovascular;  Laterality: Left;     Prior to Admission medications   Medication Sig Start Date End Date Taking? Authorizing Provider  apixaban (ELIQUIS) 5 MG TABS tablet TAKE 1 TABLET TWICE DAILY 12/24/16   [provider]  aspirin EC 81 MG tablet Take 81 mg by mouth daily.    [provider]  dexamethasone (DECADRON) 4 MG tablet TAKE 3 TABLETS BY MOUTH EVERY FOURTEEN (14) DAYS. TAKE ON TUESDAYS ON WEEKS WHEN NOT COMING FOR DARATUMUMAB INFUSION 08/20/17   [provider]  finasteride (PROSCAR) 5 MG tablet TAKE 1 TABLET EVERY DAY 07/23/17   [provider]  glipiZIDE (GLUCOTROL) 10 MG tablet Take 10 mg by mouth 2 (two) times daily before a meal.    [provider]  lisinopril (PRINIVIL,ZESTRIL) 20 MG tablet Take 20 mg by mouth daily.    [provider]  lovastatin (MEVACOR) 20 MG tablet Take 40 mg by mouth at bedtime.    [provider]  magnesium oxide (MAG-OX) 400 MG tablet Take 1 tablet by mouth daily. 09/27/17   [provider]  metFORMIN (GLUCOPHAGE) 1000 MG tablet Take 1,000 mg by mouth 2 (two) times daily with a meal.    [provider]  metoprolol (LOPRESSOR) 50 MG tablet Take 50 mg by mouth 2 (two) times daily.    [provider]  nitroGLYCERIN (NITROSTAT) 0.3 MG SL tablet Place 1 tablet (0.3  mg total) under the tongue every 5 (five) minutes as needed for chest pain. 04/05/15   Henreitta Leber, MD  pomalidomide (POMALYST) 3 MG capsule Take 1 capsule by mouth daily for days 1-21, then 7 days rest 10/14/17   [provider]  tamsulosin (FLOMAX) 0.4 MG CAPS capsule TAKE 1 CAPSULE EVERY DAY 12/17/16   [provider]  valACYclovir (VALTREX) 500 MG tablet Take 500 mg by mouth daily. 10/05/17   [provider]     Allergies Patient has no known  allergies.   Family History  Problem Relation Age of Onset  . Heart attack Father   . Diabetes Father   . Diabetes Mother   . Hypertension Mother     Social History Social History   Tobacco Use  . Smoking status: Never Smoker  . Smokeless tobacco: Never Used  Substance Use Topics  . Alcohol use: No    Alcohol/week: 0.0 standard drinks  . Drug use: No    Review of Systems  Constitutional:   No fever positive chills.  ENT:   No sore throat. No rhinorrhea. Cardiovascular:   No chest pain or syncope. Respiratory:   Positive shortness of breath and productive cough. Gastrointestinal:   Negative for abdominal pain, vomiting and diarrhea.  Musculoskeletal:   Negative for focal pain or swelling All other systems reviewed and are negative except as documented above in ROS and HPI.  ____________________________________________   PHYSICAL EXAM:  VITAL SIGNS: ED Triage Vitals  Enc Vitals Group     BP 03/22/18 2149 104/63     Pulse Rate 03/22/18 2149 88     Resp 03/22/18 2149 20     Temp 03/22/18 2149 99.7 F (37.6 C)     Temp Source 03/22/18 2149 Oral     SpO2 03/22/18 2145 94 %     Weight 03/22/18 2151 178 lb (80.7 kg)     Height 03/22/18 2151 '6\' 6"'  (1.981 m)     Head Circumference --      [facility] --      Pain Score 03/22/18 2150 7     Pain Loc --      Pain Edu? --      Excl. in [place]? --     Vital signs reviewed, nursing assessments reviewed.   Constitutional:   Alert and oriented.  Ill-appearing. Eyes:   Conjunctivae are normal. EOMI. PERRL. ENT      Head:   Normocephalic and atraumatic.      Nose:   No congestion/rhinnorhea.       Mouth/Throat:   Dry mucous membranes, no pharyngeal erythema. No peritonsillar mass.       Neck:   No meningismus. Full ROM. Hematological/Lymphatic/Immunilogical:   No cervical lymphadenopathy. Cardiovascular:   RRR. Symmetric bilateral radial and DP pulses.  No murmurs. Cap refill less than 2 seconds. Respiratory:   Normal  respiratory effort, mild tachypnea.. Breath sounds are rhonchorous bilaterally.  No wheezes Gastrointestinal:   Soft and nontender. Non distended. There is no CVA tenderness.  No rebound, rigidity, or guarding. Musculoskeletal:   Normal range of motion in all extremities. No joint effusions.  No lower extremity tenderness.  No edema. Neurologic:   Normal speech and language.  Motor grossly intact. No acute focal neurologic deficits are appreciated.  Skin:    Skin is warm, dry and intact. No rash noted.  No petechiae, purpura, or bullae.  ____________________________________________    LABS (pertinent positives/negatives) (all labs ordered are listed, but  only abnormal results are displayed) Labs Reviewed  LACTIC ACID, PLASMA - Abnormal; Notable for the following components:      Result Value   Lactic Acid, Venous 2.4 (*)    All other components within normal limits  COMPREHENSIVE METABOLIC PANEL - Abnormal; Notable for the following components:   Sodium 131 (*)    Glucose, Bld 226 (*)    BUN 31 (*)    Creatinine, Ser 2.05 (*)    Calcium 8.8 (*)    Total Protein 5.7 (*)    Albumin 3.0 (*)    GFR calc non Af Amer 32 (*)    GFR calc Af Amer 37 (*)    All other components within normal limits  CBC WITH DIFFERENTIAL/PLATELET - Abnormal; Notable for the following components:   WBC 10.8 (*)    RBC 2.85 (*)    Hemoglobin 9.2 (*)    HCT 28.4 (*)    RDW 16.3 (*)    Platelets 127 (*)    Neutro Abs 8.6 (*)    Abs Immature Granulocytes 0.08 (*)    All other components within normal limits  PROTIME-INR - Abnormal; Notable for the following components:   Prothrombin Time 17.9 (*)    INR 1.5 (*)    All other components within normal limits  TROPONIN I - Abnormal; Notable for the following components:   Troponin I 0.04 (*)    All other components within normal limits  CULTURE, BLOOD (ROUTINE X 2)  CULTURE, BLOOD (ROUTINE X 2)  URINE CULTURE  LIPASE, BLOOD  PROCALCITONIN  INFLUENZA  PANEL BY PCR (TYPE A & B)  LACTIC ACID, PLASMA  URINALYSIS, COMPLETE (UACMP) WITH MICROSCOPIC   ____________________________________________   EKG  Interpreted by me Sinus rhythm rate of 88, normal axis.  Poor R wave progression, LVH in the high lateral leads, no acute ischemic changes.  ____________________________________________    RADIOLOGY  Ct Head Wo Contrast  Result Date: 03/22/2018 CLINICAL DATA:  Shortness of breath, nausea, vomiting, and diarrhea beginning today. Congestion and productive cough. Neck pain. Previous history of cryptococcosis. EXAM: CT HEAD WITHOUT CONTRAST TECHNIQUE: Contiguous axial images were obtained from the base of the skull through the vertex without intravenous contrast. COMPARISON:  None. FINDINGS: Brain: Diffuse cerebral atrophy. Focal area of encephalomalacia in the right anterior frontal lobe likely representing old infarct. Old infarct in the right inferior cerebellum. Ventricular dilatation consistent with central atrophy. Low-attenuation changes in the deep white matter consistent small vessel ischemia. No mass-effect or midline shift. No abnormal extra-axial fluid collections. Gray-white matter junctions are distinct. Basal cisterns are not effaced. No acute intracranial hemorrhage. Vascular: Moderate intracranial arterial vascular calcifications. Skull: Calvarium appears intact. Sinuses/Orbits: Paranasal sinuses and mastoid air cells are clear. Other: None. IMPRESSION: No acute intracranial abnormalities. Chronic atrophy and small vessel ischemic changes. 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 Electronically Signed   By: Lucienne Capers M.D.   On: 03/22/2018 23:39   Dg Chest Port 1 View  Result Date: 03/22/2018 CLINICAL DATA:  Altered mental status, fever EXAM: PORTABLE CHEST 1 VIEW COMPARISON:  10/20/2017, 04/04/2015 FINDINGS: Left-sided pacing device as before. Bilateral reticular opacity and vague airspace disease in the left upper lobe  and peripheral left base. Mild cardiomegaly. No pneumothorax. IMPRESSION: 1. Bilateral reticular opacity with vague foci of airspace disease in the left upper lobe and peripheral left lower lobe, suspect for pneumonia, possibly atypical. Imaging follow-up to resolution is recommended to exclude mass lesion, particularly in the left upper  lobe. 2. Mild cardiomegaly. Electronically Signed   By: Donavan Foil M.D.   On: 03/22/2018 22:28    ____________________________________________   PROCEDURES .Critical Care Performed by: Carrie Mew, MD Authorized by: Carrie Mew, MD   Critical care provider statement:    Critical care time (minutes):  35   Critical care time was exclusive of:  Separately billable procedures and treating other patients   Critical care was necessary to treat or prevent imminent or life-threatening deterioration of the following conditions:  Respiratory failure and sepsis   Critical care was time spent personally by me on the following activities:  Development of treatment plan with patient or surrogate, discussions with consultants, evaluation of patient's response to treatment, examination of patient, obtaining history from patient or surrogate, ordering and performing treatments and interventions, ordering and review of laboratory studies, ordering and review of radiographic studies, pulse oximetry, re-evaluation of patient's condition and review of old charts    ____________________________________________  [place] associated pneumonia, pulmonary edema, aspiration pneumonia, influenza,  CLINICAL IMPRESSION / ASSESSMENT AND PLAN / ED COURSE  Medications ordered in the ED: Medications  ceFEPIme (MAXIPIME) 2 g in sodium chloride 0.9 % 100 mL IVPB (has no administration in time range)  vancomycin (VANCOCIN) IVPB 1000 mg/200 mL premix (has no administration in time range)  metroNIDAZOLE (FLAGYL) IVPB 500 mg (has no administration  in time range)  sodium chloride 0.9 % bolus 1,000 mL (1,000 mLs Intravenous New Bag/Given 03/22/18 2252)  ceFEPIme (MAXIPIME) 2 g in sodium chloride 0.9 % 100 mL IVPB (2 g Intravenous New Bag/Given 03/23/18 0005)    Pertinent labs & imaging results that were available during my care of the patient were reviewed by me and considered in my medical decision making (see chart for details).    Patient presents with shortness of breath.  Has a temperature of 99.7, tachypnea.  Doubt ACS PE dissection or pericarditis.  Suspect healthcare associated pneumonia, aspiration pneumonia, or influenza.  Possibly pulmonary edema in the setting of his history of ischemic cardiomyopathy with an EF of about 25% status post ICD.  Patient given 1 L bolus due to suspected sepsis and dehydration.  Broad-spectrum antibiotics started, code sepsis initiated after lactate resulted at 2.4.  He had a single blood pressure of 89/64 at 11 PM, but on serial rechecks afterward, subsequent blood pressures were normal.  If he has another low blood pressure, would plan to start dopamine given his reduced ejection fraction for inotrope he and increased vascular tone.  ----------------------------------------- 1:11 AM on 03/23/2018 -----------------------------------------  Sepsis reassessment has been completed.  Blood pressure remains normal, not in shock, not requiring vasopressors with dopamine at this time.  Oxygenation stable on 3 L nasal cannula.  Discussed with patient logistic center at [facility].  Awaiting callback from oncologist to discuss transfer for management of H CAP.      ____________________________________________   FINAL CLINICAL IMPRESSION(S) / ED DIAGNOSES    Final diagnoses:  Acute respiratory failure with hypoxia ([place])  Healthcare-associated pneumonia     ED Discharge Orders    None      Portions of this note were generated with dragon dictation software. Dictation errors may occur despite best  attempts at proofreading.   Carrie Mew, MD 03/23/18 [phone]

## 2018-03-23 NOTE — ED Notes (Signed)
Report given to Elwanda [place], RN at [facility]. Patient to be transported via EMS in stable condition. EMTALA and med necessity form completed. Patient's wife signed ED transfer consent in place of patient.

## 2018-03-23 NOTE — ED Notes (Signed)
Patient resting in bed with eyes closed. Patient's daughters at bed side. No adverse reactions observed to ABT treatment.

## 2018-03-23 NOTE — ED Notes (Signed)
Pending bed assignment at [facility]

## 2018-03-23 NOTE — ED Notes (Signed)
emtala reviewed by charge RN 

## 2018-03-24 LAB — URINE CULTURE: Culture: NO GROWTH

## 2018-03-27 LAB — CULTURE, BLOOD (ROUTINE X 2)
Culture: NO GROWTH
Culture: NO GROWTH
Special Requests: ADEQUATE
Special Requests: ADEQUATE

## 2018-04-15 ENCOUNTER — Other Ambulatory Visit: Payer: Self-pay

## 2018-04-15 ENCOUNTER — Other Ambulatory Visit: Payer: Medicare HMO | Admitting: Primary Care

## 2018-04-15 ENCOUNTER — Telehealth: Payer: Self-pay | Admitting: Primary Care

## 2018-04-15 DIAGNOSIS — Z515 Encounter for palliative care: Secondary | ICD-10-CM

## 2018-04-15 NOTE — Telephone Encounter (Signed)
Daughter York Grice called to ask about services, what we'd send out to the home.Described community palliative care. Will call with telephone encounter later today to patient home.

## 2018-04-15 NOTE — Telephone Encounter (Signed)
T/c for agreed-upon and scheduled  appointment. Family members needed to assist patient have just gone out to lunch/errands and are not available. Will attempt one more phone meeting this afternoon per patient request.

## 2018-04-15 NOTE — Progress Notes (Signed)
3 attempts to have telehealth meeting today, Difficulty with technology. Will try again Thursday per family request.

## 2018-04-15 NOTE — Telephone Encounter (Signed)
T/c to home to schedule palliative virtual visit. Spoke with Ronnie Andersen, also had left message for [place]. Scheduled telephone  Evaluation for this afternoon and telemedicine visit on Thursday when technology will be available.

## 2018-04-17 ENCOUNTER — Other Ambulatory Visit: Payer: Self-pay

## 2018-04-17 ENCOUNTER — Other Ambulatory Visit: Payer: Medicare HMO | Admitting: Primary Care

## 2018-04-17 DIAGNOSIS — Z515 Encounter for palliative care: Secondary | ICD-10-CM

## 2018-04-17 NOTE — Progress Notes (Signed)
[facility] Palliative Care Consult Note Telephone: [phone]       Fax: [phone]  TELEPHONE EVALUATION  STATEMENT Due to the COVID-19 crisis, this TELEPHONE EVALUATION VISIT was done via telephone from my office and it was initiated and consent by this patient and or family.   PATIENT NAME: Ronnie Andersen Andersen DOB: May 01, 1950 MRN: [id number]  PRIMARY CARE PROVIDER:   Hoover Brunette, MD  REFERRING PROVIDER:  Hoover Brunette, MD [id number] [facility] [place], [place] [address]  RESPONSIBLE PARTY:   Extended Emergency Contact Information Primary Emergency Contact: Ronnie, Andersen Address: [address], [place] [address] Ronnie Andersen Andersen Phone: [phone] Relation: Spouse Secondary Emergency Contact: Ronnie Andersen Andersen Mobile Phone: [phone] Relation: Ronnie Andersen  Palliative Medicine was asked to consult on the case of Ronnie Andersen Andersen. This is the initial consultation. Present by telephonic evaluation were patient Ronnie Andersen Andersen, Ronnie Andersen Andersen and Ronnie Andersen Andersen.   ASSESSMENT and RECOMMENDATIONS:   1. Mobility/ADL deficits: Recommend PT, OT. Home health to start soon to improve mobility and safety for patient.  Currently uses walker with stand by assistance. Ronnie Andersen states she will help with bathing but he is not getting into shower. Has elevated toilet seat. OT can help with maintaining adl independence.   2. Skin integrity: Recommend SN from Home health for monitoring of wounds, disease teaching. Has 2 wounds on back per Ronnie Andersen's report. One sounds as if it has slough. SN can also draw weekly labs while seeing patient for wound care.   3. Pain relief: Recommend conservative opioid titration to comfort.  Level currently is 3-4/10. Patient would like the pain level to be at 0, which may not be possible. Has taken oxycodone 2.5 mg and 1.25 mg for pain.Low dose is suggested due to fluconazole potentiating. Does get sleepy  but able to be awakened.   4. Oxygen : Recommend home oxygen concentrator at 2-4 liters/Ronnie Andersen Andersen. Currently trying to obtain this through oxygen company. Family  reported using a friend's pulse oximeter and states PO2 sat =88% at rest on room air. When he lies down it goes to 83%. Family states PCP is sending  orders for home oxygen.  5. Goals of Care: Full Code. Plans to keep doing chemotherapy, will do an oral preparation, Pomalyst, and steroids. Discussed family taking time to discuss medical wishes and planning before a medical crisis. They stated they would consider getting some of the advance care planning done soon. Will follow up on next visit with care planning goals.  Palliative care to continue to follow for goals of care planning and symptom management. Call in 4-6 weeks to assess patient for visit/telemedicine visit.   I spent 30 minutes providing this consultation,  from 1130 to 1200. More than 50% of the time in this consultation was spent coordinating communication.   HISTORY OF PRESENT ILLNESS:  Ronnie Andersen Andersen is a 68 y.o. year old male with multiple medical problems including Multiple myeloma, relapsed, CAD, DM, HTN, v tach with icd, recent cryptococcal meningitis and fungemia, with recurrent cryptococcal pneumonia. Palliative Care was asked to help address goals of care.   CODE STATUS: FULL  PPS: 40% HOSPICE ELIGIBILITY/DIAGNOSIS: TBD  ROS:  Reported by self and family:  190 lb 6 months ago, now 183 lb.  General: NAD,describes frailty Cardiovascular: denies chest pain Pulmonary:coughs occ, productive clear sputum, reported using a friends pulse oximeter and  states it's 88% at rest on room air. When he lies down it goes to 83%, HS orthopnea. Abdomen: appetite poor, intake moderate. BM with loose stools GU: no suprapubic tenderness Extremities: no edema in LE, Fell at rehab a month ago.  Skin: no rashes, reports place on back from LP, "been doctoring on it"  Red area, another site  on lower back that has yellow, 2 cm. Neurological: Weakness, walks with help, states LE weakness and neuropathies, no dizziness, occasional forgetfulness, Sleeps/naps  during day and is awake at night. Has tried oxycodone and melatonin 5 mg  to sleep.   Ronnie Andersen Skeeters DNP, AGPCNP-BC    PAST MEDICAL HISTORY:  Past Medical History:  Diagnosis Date  . CAD (coronary artery disease)   . Diabetes mellitus without complication ([place])   . HLD (hyperlipidemia)   . Hypertension   . MI (myocardial infarction) ([place])     SOCIAL HX:  Social History   Tobacco Use  . Smoking status: Never Smoker  . Smokeless tobacco: Never Used  Substance Use Topics  . Alcohol use: No    Alcohol/week: 0.0 standard drinks    ALLERGIES: No Known Allergies   PERTINENT MEDICATIONS:  Outpatient Encounter Medications as of 04/17/2018  Medication Sig  . apixaban (ELIQUIS) 5 MG TABS tablet TAKE 1 TABLET TWICE DAILY  . aspirin EC 81 MG tablet Take 81 mg by mouth daily.  Marland Kitchen dexamethasone (DECADRON) 4 MG tablet TAKE 3 TABLETS BY MOUTH EVERY FOURTEEN (14) DAYS. TAKE ON TUESDAYS ON WEEKS WHEN NOT COMING FOR DARATUMUMAB INFUSION  . finasteride (PROSCAR) 5 MG tablet TAKE 1 TABLET EVERY DAY  . glipiZIDE (GLUCOTROL) 10 MG tablet Take 10 mg by mouth 2 (two) times daily before a meal.  . lisinopril (PRINIVIL,ZESTRIL) 20 MG tablet Take 20 mg by mouth daily.  Marland Kitchen lovastatin (MEVACOR) 20 MG tablet Take 40 mg by mouth at bedtime.  . magnesium oxide (MAG-OX) 400 MG tablet Take 1 tablet by mouth daily.  . metFORMIN (GLUCOPHAGE) 1000 MG tablet Take 1,000 mg by mouth 2 (two) times daily with a meal.  . metoprolol (LOPRESSOR) 50 MG tablet Take 50 mg by mouth 2 (two) times daily.  . nitroGLYCERIN (NITROSTAT) 0.3 MG SL tablet Place 1 tablet (0.3 mg total) under the tongue every 5 (five) minutes as needed for chest pain.  . pomalidomide (POMALYST) 3 MG capsule Take 1 capsule by mouth daily for days 1-21, then 7 days rest  . tamsulosin  (FLOMAX) 0.4 MG CAPS capsule TAKE 1 CAPSULE EVERY DAY  . valACYclovir (VALTREX) 500 MG tablet Take 500 mg by mouth daily.   No facility-administered encounter medications on file as of 04/17/2018.

## 2018-06-09 DEATH — deceased

## 2020-03-08 IMAGING — CT CT CHEST WITHOUT CONTRAST
2 of 3 series · 15 of 36 positions shown, 18 images · non-contrast
Comparison: Chest radiograph 03/22/2018

CLINICAL DATA: Shortness of breath, nausea, vomiting, and diarrhea
today.

EXAM:
CT CHEST WITHOUT CONTRAST
TECHNIQUE: Multidetector CT imaging of the chest was performed following the
standard protocol without IV contrast.

[Series 3: thorax · axial · 0.82mm/px · z∈[-962,-680]mm · 12 of 167 slices shown, 15 images]
[im 13/167  mediastinal]
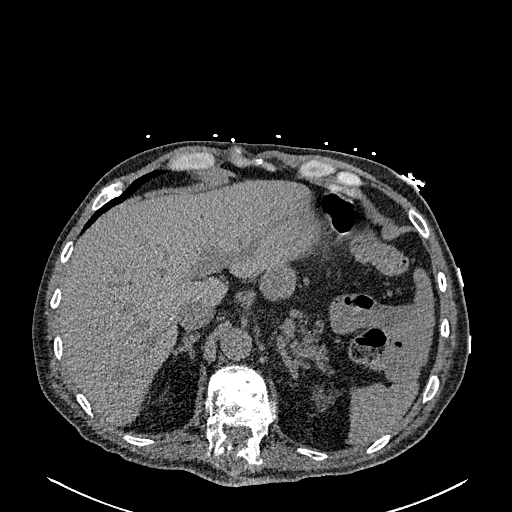
[im 13/167  lung]
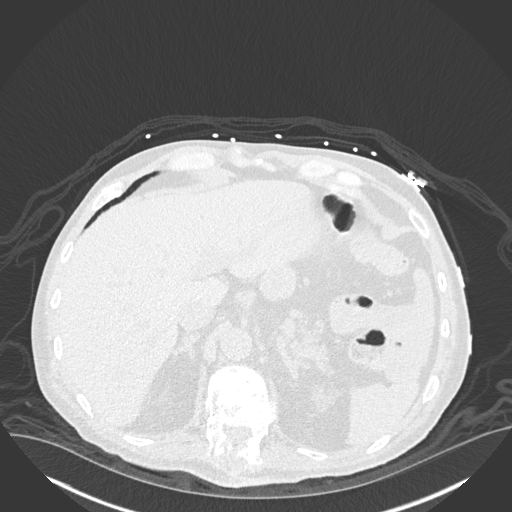
[im 25/167  lung]
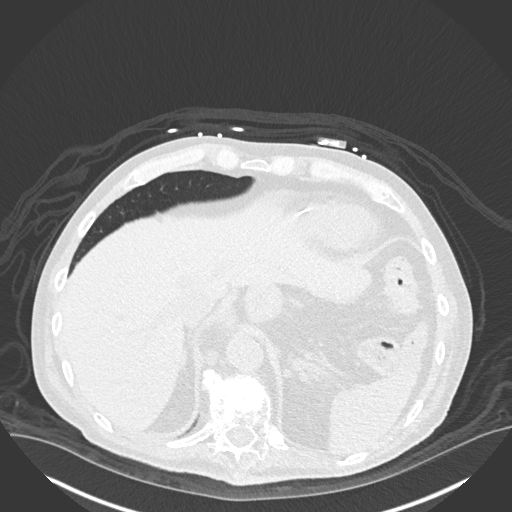
[im 37/167  lung]
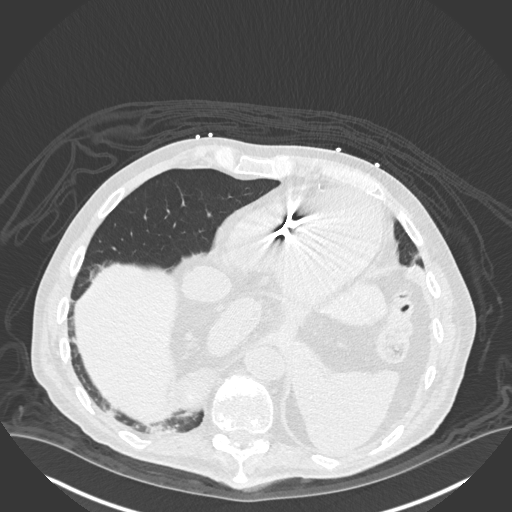
[im 50/167  lung]
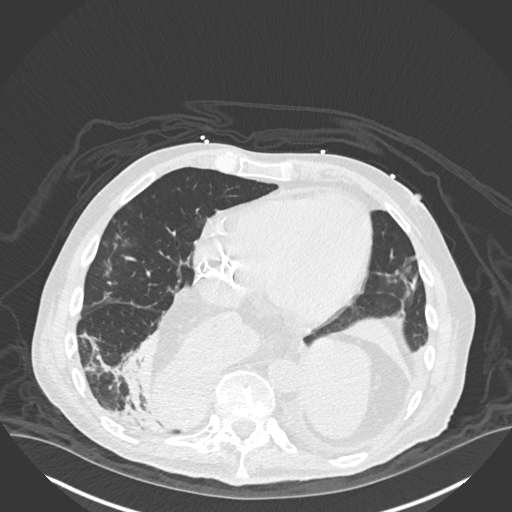
[im 62/167  mediastinal]
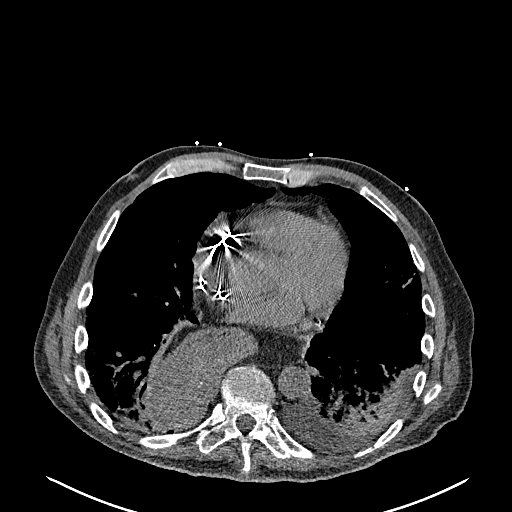
[im 62/167  lung]
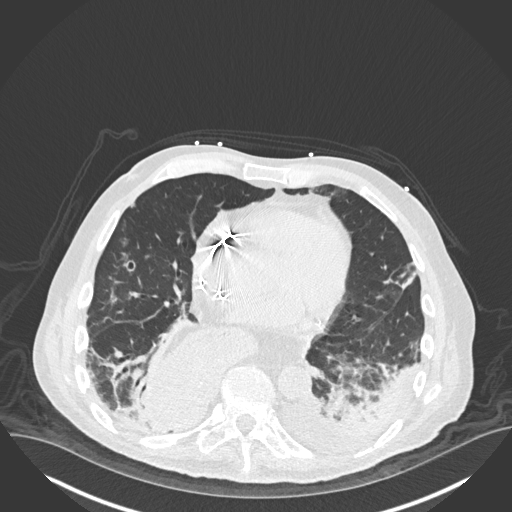
[im 74/167  lung]
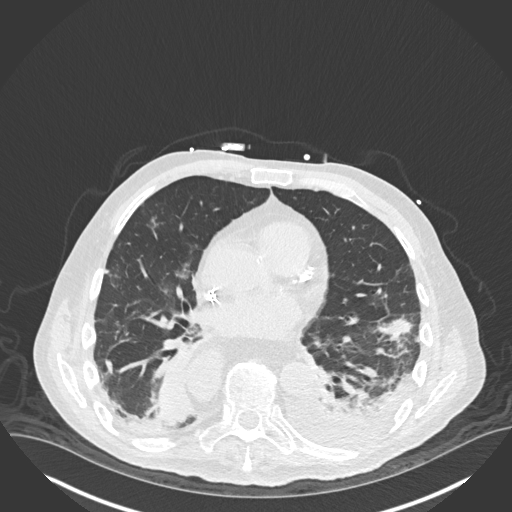
[im 93/167  lung]
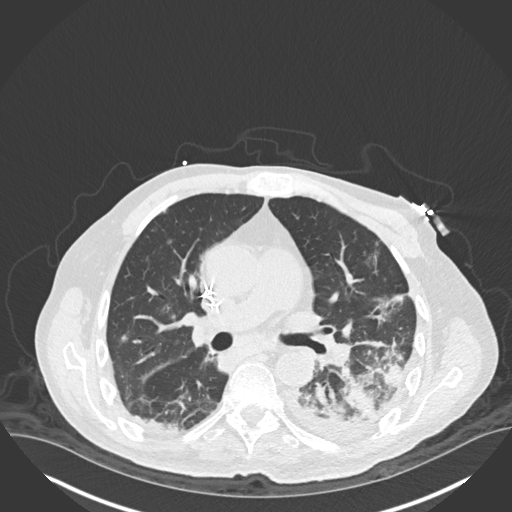
[im 105/167  lung]
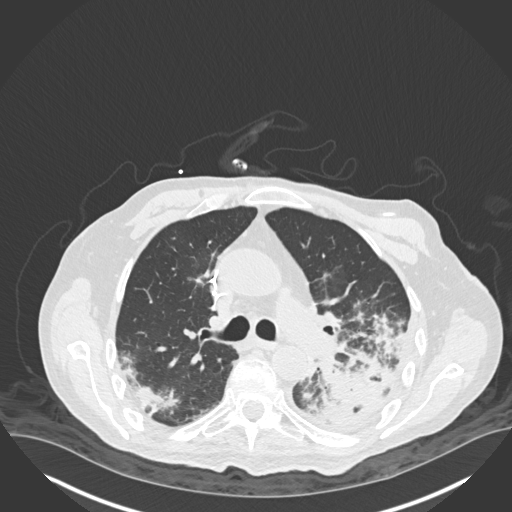
[im 117/167  mediastinal]
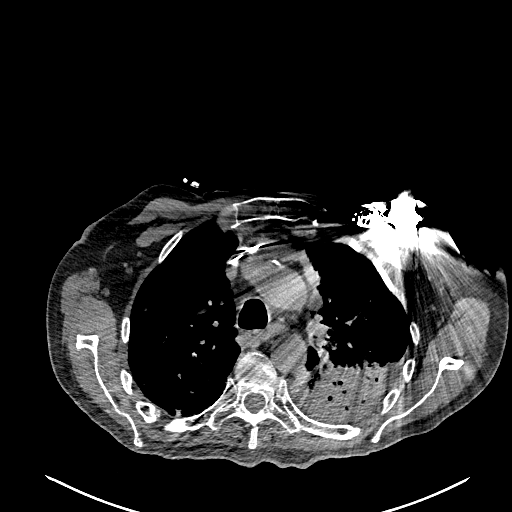
[im 117/167  lung]
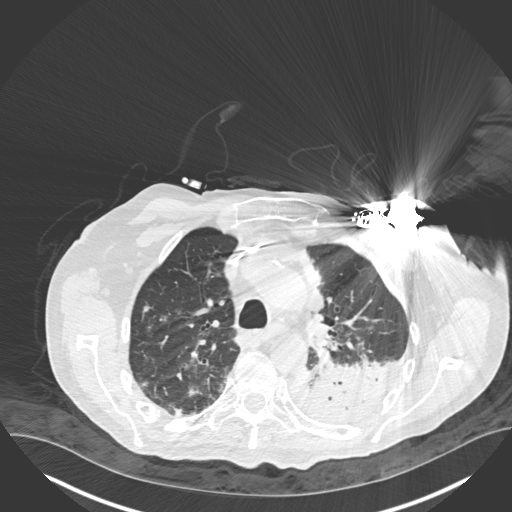
[im 130/167  lung]
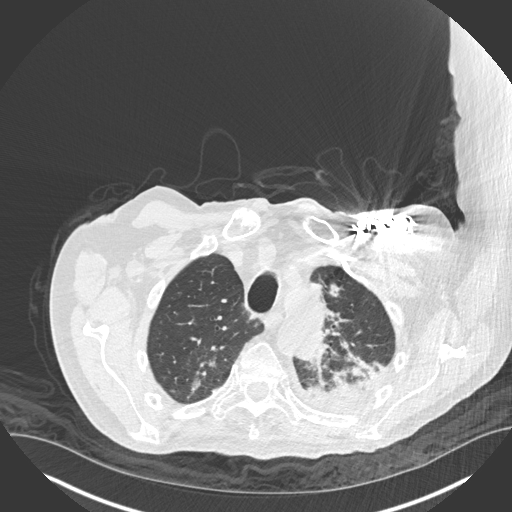
[im 142/167  lung]
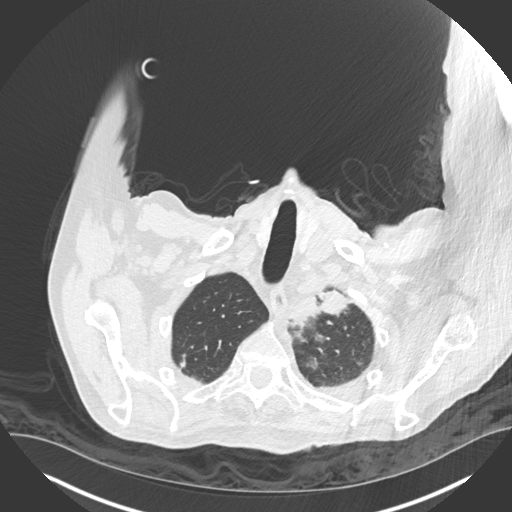
[im 154/167  lung]
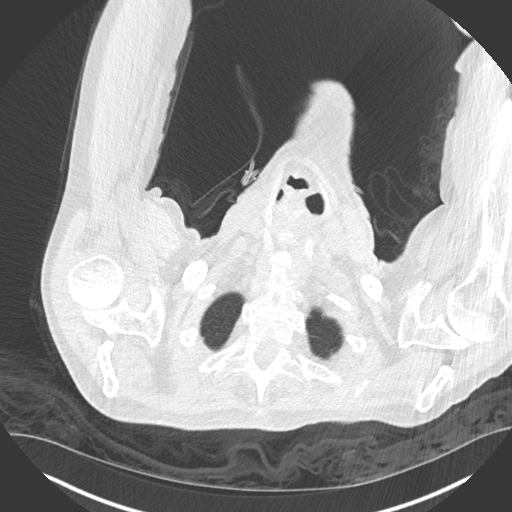

[Series 6: coronal · coronal · 0.68mm/px · 3 of 134 slices shown]
[im 27/134  lung]
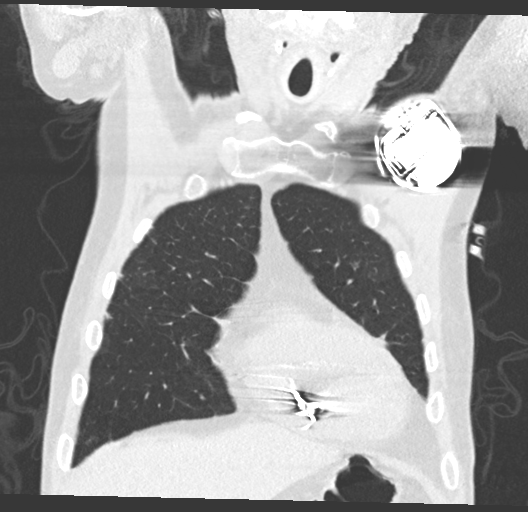
[im 54/134  lung]
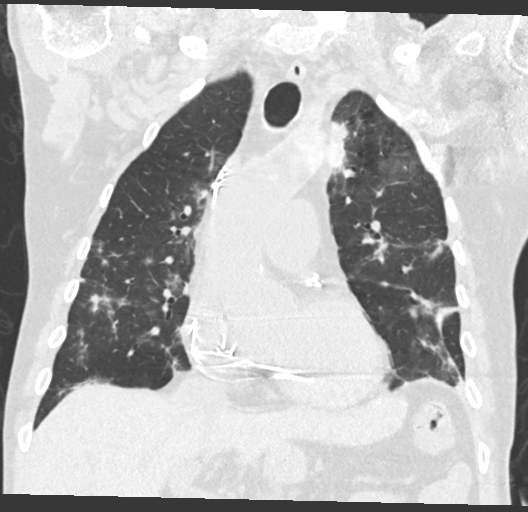
[im 80/134  lung]
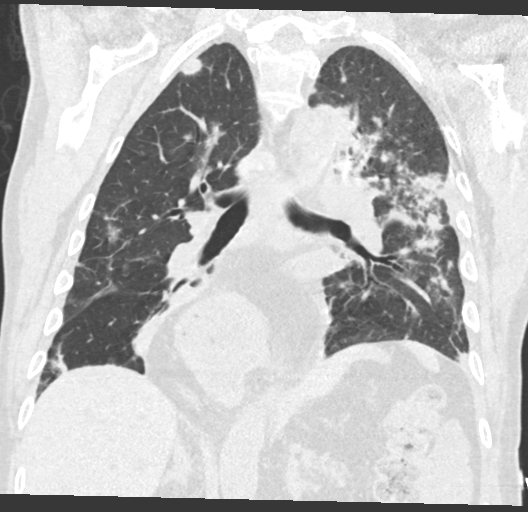

[15 of 36 positions shown; findings below may reference images not displayed]

FINDINGS: Cardiovascular: Evaluation of vascular structures is limited without
IV contrast material. Normal heart size. Mild pericardial thickening
without pericardial effusion. Cardiac pacemaker. Coronary artery
calcifications. Normal caliber thoracic aorta. Scattered
calcifications.

Mediastinum/Nodes: Moderately prominent mediastinal lymph nodes
without pathologic enlargement, likely reactive. Large esophageal
hiatal hernia containing most of the stomach. Esophagus is
decompressed.

Lungs/Pleura: Large area of consolidation in the left upper lung and
left lower lung. Small areas of consolidation in the right lower
lung with patchy nodular ground-glass infiltrates in both lungs. Air
bronchograms are present. Scattered bronchiectasis. Small left
pleural effusion. Changes are most likely to represent multifocal
pneumonia although primary and metastatic malignancy could also have
this appearance. Septic emboli or atypical pneumonia such as
mycobacterial or fungal pneumonia could also have this appearance.
Recommend follow-up after resolution of acute process to exclude
underlying pulmonary mass lesions. Airways are patent. No
pneumothorax.

Upper Abdomen: No acute abnormalities. Degenerative changes in the
spine.

Musculoskeletal: Compression of T12 and L1 vertebra with associated
degenerative change and degenerative disc disease, likely chronic.
IMPRESSION: Large areas of consolidation in the left upper lung and left lower
lung with patchy nodular ground-glass infiltrates in both lungs.
Small left pleural effusion. Changes most likely to represent
multifocal pneumonia although primary and metastatic malignancy or
atypical pneumonia could also have this appearance. Suggest
follow-up after resolution of acute process to exclude underlying
pulmonary mass lesions.
